# Patient Record
Sex: Female | Born: 1955
Health system: Southern US, Community
[De-identification: ages and names within clinical notes are randomized; demographics above are authoritative.]

## PROBLEM LIST (undated history)

## (undated) DIAGNOSIS — R011 Cardiac murmur, unspecified: Secondary | ICD-10-CM

## (undated) DIAGNOSIS — E785 Hyperlipidemia, unspecified: Secondary | ICD-10-CM

## (undated) DIAGNOSIS — N189 Chronic kidney disease, unspecified: Secondary | ICD-10-CM

## (undated) DIAGNOSIS — K219 Gastro-esophageal reflux disease without esophagitis: Secondary | ICD-10-CM

## (undated) DIAGNOSIS — I10 Essential (primary) hypertension: Secondary | ICD-10-CM

## (undated) DIAGNOSIS — E119 Type 2 diabetes mellitus without complications: Secondary | ICD-10-CM

## (undated) HISTORY — DX: Gastro-esophageal reflux disease without esophagitis: K21.9

## (undated) HISTORY — DX: Cardiac murmur, unspecified: R01.1

## (undated) HISTORY — DX: Type 2 diabetes mellitus without complications: E11.9

## (undated) HISTORY — PX: WISDOM TOOTH EXTRACTION: SHX21

## (undated) HISTORY — DX: Hyperlipidemia, unspecified: E78.5

## (undated) HISTORY — PX: CHOLECYSTECTOMY: SHX55

## (undated) HISTORY — PX: ADRENALECTOMY: SHX876

## (undated) HISTORY — PX: NEPHRECTOMY: SHX65

## (undated) HISTORY — PX: EYE SURGERY: SHX253

## (undated) HISTORY — PX: ABDOMINAL HYSTERECTOMY: SHX81

## (undated) HISTORY — DX: Chronic kidney disease, unspecified: N18.9

---

## 1998-10-09 HISTORY — PX: CHOLECYSTECTOMY: SHX55

## 2011-01-03 ENCOUNTER — Ambulatory Visit (INDEPENDENT_AMBULATORY_CARE_PROVIDER_SITE_OTHER): Payer: 59 | Admitting: Internal Medicine

## 2011-01-03 DIAGNOSIS — K219 Gastro-esophageal reflux disease without esophagitis: Secondary | ICD-10-CM

## 2011-01-03 DIAGNOSIS — R05 Cough: Secondary | ICD-10-CM

## 2011-01-15 NOTE — Consult Note (Signed)
NAME:  Samantha Camacho, Samantha Camacho             ACCOUNT NO.:  [id number]  MEDICAL RECORD NO.:  [id number]          PATIENT TYPE:  LOCATION:                                 FACILITY:  PHYSICIAN:  Lionel December, M.D.         DATE OF BIRTH:  DATE OF CONSULTATION:  01/03/2011 DATE OF DISCHARGE:                                CONSULTATION   REASON FOR CONSULT:  Chronic cough.  HISTORY OF PRESENT ILLNESS:  Samantha Camacho is a 55 year old female referred to our office by Dr. Ernestine Conrad for a chronic cough, states she can feel the mucus in her throat.  She denies any acid reflux.  She says she has had these symptoms for 2-3 years.  She does say her cough is worse after she eats a meal or drinks.  She denies a prior history of allergies. She states she was seen by an ENT for her cough about a year ago and her exam was normal.  She was taken off ACE inhibitor and placed on Norvasc, but there was no change in her cough.  She says her symptoms are not worse at night.  She does, however, cough when she goes to bed.  She has been taking her omeprazole right before she goes to bed.  She says her appetite is good.  There has been no weight loss.  She has a normal bowel movement.  She denies any melena or rectal bleeding.  There has been no dysphagia.  No weight loss.  There are no known allergies.  Home medications include: 1. Norvasc 10 mg a day. 2. Premarin 0.625 a day. 3. Omeprazole 40 mg a day.  SURGERY:  She has had a complete hysterectomy, cholecystectomy.  She has had 2 C-sections.  She has hypertension.  Her mother is deceased from an MI.  Her father was deceased.  He was an alcoholic.  Three sisters and they are all diabetics, one brother that is a diabetic.  She is married. She is unemployed.  She does not smoke, drink or do drugs and she has 2 children in good health.  OBJECTIVE:  VITAL SIGNS:  Her weight is 223.5, her height is 5 feet 1 inches, her temp is 97.7, her blood pressure is 144/84 and  her pulse is 72.  MOUTH:  She has natural teeth.  Her oral mucosa is moist.  There are no lesions. EYES:  Her conjunctivae are pink.  Her sclerae are anicteric. NECK:  Her thyroid is normal.  There is no cervical lymphadenopathy. LUNGS:  Clear.  Chest x-ray December 13, 2010, revealed no active cardiopulmonary abnormalities. HEART:  Regular rate and rhythm. ABDOMEN:  Obese, soft.  Bowel sounds are positive.  No masses.  No tenderness. EXTREMITIES:  There is no edema to her lower extremities.  ASSESSMENT:  Samantha Camacho is a 55 year old female presenting today with a chronic cough.  This very well could be symptoms of acid reflux.  RECOMMENDATIONS:  She will elevate the head of her bed.  She will take her omeprazole new prescription 20 mg will be ordered.  She will take b.i.d. 30 minutes before breakfast and  30 minutes before supper.  I would also like to get a pH study on her and further recommendations once we have the pH study back.  She should call with a progress report in 2 weeks.    ______________________________ Dorene Ar, NP   ______________________________ Lionel December, M.D.    TS/MEDQ  D:  01/03/2011  T:  01/04/2011  Job:  [id number]  Electronically Signed by Dorene Ar PA on 01/10/2011 08:34:42 AM Electronically Signed by Lionel December M.D. on 01/15/2011 01:27:56 PM

## 2011-04-13 ENCOUNTER — Ambulatory Visit (INDEPENDENT_AMBULATORY_CARE_PROVIDER_SITE_OTHER): Payer: 59 | Admitting: Internal Medicine

## 2015-07-24 LAB — HM DIABETES EYE EXAM

## 2016-10-12 ENCOUNTER — Inpatient Hospital Stay (HOSPITAL_COMMUNITY)
Admission: EM | Admit: 2016-10-12 | Discharge: 2016-10-14 | DRG: 639 | Disposition: A | Discharge status: Home / Self Care | Payer: 59 | Attending: Nephrology | Admitting: Nephrology

## 2016-10-12 ENCOUNTER — Encounter (HOSPITAL_COMMUNITY): Payer: Self-pay | Admitting: Emergency Medicine

## 2016-10-12 DIAGNOSIS — Z79899 Other long term (current) drug therapy: Secondary | ICD-10-CM | POA: Diagnosis not present

## 2016-10-12 DIAGNOSIS — E1110 Type 2 diabetes mellitus with ketoacidosis without coma: Secondary | ICD-10-CM | POA: Diagnosis not present

## 2016-10-12 DIAGNOSIS — E1159 Type 2 diabetes mellitus with other circulatory complications: Secondary | ICD-10-CM | POA: Insufficient documentation

## 2016-10-12 DIAGNOSIS — I10 Essential (primary) hypertension: Secondary | ICD-10-CM | POA: Diagnosis not present

## 2016-10-12 DIAGNOSIS — E876 Hypokalemia: Secondary | ICD-10-CM | POA: Diagnosis not present

## 2016-10-12 DIAGNOSIS — Z833 Family history of diabetes mellitus: Secondary | ICD-10-CM | POA: Diagnosis not present

## 2016-10-12 DIAGNOSIS — E1310 Other specified diabetes mellitus with ketoacidosis without coma: Secondary | ICD-10-CM | POA: Diagnosis not present

## 2016-10-12 DIAGNOSIS — Z23 Encounter for immunization: Secondary | ICD-10-CM | POA: Diagnosis not present

## 2016-10-12 HISTORY — DX: Essential (primary) hypertension: I10

## 2016-10-12 LAB — BASIC METABOLIC PANEL
Anion gap: 19 — ABNORMAL HIGH (ref 5–15)
Anion gap: 12 (ref 5–15)
Anion gap: 5 (ref 5–15)
Anion gap: 5 (ref 5–15)
BUN: 22 mg/dL — AB (ref 6–20)
BUN: 18 mg/dL (ref 6–20)
BUN: 14 mg/dL (ref 6–20)
BUN: 12 mg/dL (ref 6–20)
CALCIUM: 8.5 mg/dL — AB (ref 8.9–10.3)
CALCIUM: 8.3 mg/dL — AB (ref 8.9–10.3)
CO2: 22 mmol/L (ref 22–32)
CO2: 21 mmol/L — ABNORMAL LOW (ref 22–32)
CO2: 26 mmol/L (ref 22–32)
CO2: 24 mmol/L (ref 22–32)
CREATININE: 1.14 mg/dL — AB (ref 0.44–1.00)
CREATININE: 0.72 mg/dL (ref 0.44–1.00)
CREATININE: 0.62 mg/dL (ref 0.44–1.00)
Calcium: 9.7 mg/dL (ref 8.9–10.3)
Calcium: 8.6 mg/dL — ABNORMAL LOW (ref 8.9–10.3)
Chloride: 101 mmol/L (ref 101–111)
Chloride: 114 mmol/L — ABNORMAL HIGH (ref 101–111)
Chloride: 117 mmol/L — ABNORMAL HIGH (ref 101–111)
Chloride: 119 mmol/L — ABNORMAL HIGH (ref 101–111)
Creatinine, Ser: 0.92 mg/dL (ref 0.44–1.00)
GFR calc Af Amer: >60 mL/min (ref >60)
GFR calc Af Amer: >60 mL/min (ref >60)
GFR calc Af Amer: >60 mL/min (ref >60)
GFR, EST AFRICAN AMERICAN: 59 mL/min — AB (ref >60)
GFR, EST NON AFRICAN AMERICAN: 51 mL/min — AB (ref >60)
GLUCOSE: 433 mg/dL — AB (ref 65–99)
GLUCOSE: 291 mg/dL — AB (ref 65–99)
GLUCOSE: 172 mg/dL — AB (ref 65–99)
Glucose, Bld: 667 mg/dL (ref 65–99)
Potassium: 4 mmol/L (ref 3.5–5.1)
Potassium: 3.8 mmol/L (ref 3.5–5.1)
Potassium: 4.2 mmol/L (ref 3.5–5.1)
Potassium: 3.4 mmol/L — ABNORMAL LOW (ref 3.5–5.1)
SODIUM: 142 mmol/L (ref 135–145)
SODIUM: 147 mmol/L — AB (ref 135–145)
Sodium: 148 mmol/L — ABNORMAL HIGH (ref 135–145)
Sodium: 148 mmol/L — ABNORMAL HIGH (ref 135–145)

## 2016-10-12 LAB — URINALYSIS, ROUTINE W REFLEX MICROSCOPIC
Bacteria, UA: NONE SEEN
Bilirubin Urine: NEGATIVE
Hgb urine dipstick: NEGATIVE
Ketones, ur: 80 mg/dL — AB
Leukocytes, UA: NEGATIVE
Nitrite: NEGATIVE
PH: 5 (ref 5.0–8.0)
Protein, ur: NEGATIVE mg/dL
Specific Gravity, Urine: 1.02 (ref 1.005–1.030)

## 2016-10-12 LAB — BLOOD GAS, VENOUS
Acid-base deficit: 3.4 mmol/L — ABNORMAL HIGH (ref 0.0–2.0)
Bicarbonate: 20.7 mmol/L (ref 20.0–28.0)
FIO2: 21
O2 Saturation: 74.3 %
PCO2 VEN: 43.1 mmHg — AB (ref 44.0–60.0)
PH VEN: 7.322 (ref 7.250–7.430)
PO2 VEN: 44.8 mmHg (ref 32.0–45.0)

## 2016-10-12 LAB — GLUCOSE, CAPILLARY
GLUCOSE-CAPILLARY: 337 mg/dL — AB (ref 65–99)
GLUCOSE-CAPILLARY: 188 mg/dL — AB (ref 65–99)
GLUCOSE-CAPILLARY: 139 mg/dL — AB (ref 65–99)
Glucose-Capillary: 360 mg/dL — ABNORMAL HIGH (ref 65–99)
Glucose-Capillary: 283 mg/dL — ABNORMAL HIGH (ref 65–99)
Glucose-Capillary: 255 mg/dL — ABNORMAL HIGH (ref 65–99)
Glucose-Capillary: 231 mg/dL — ABNORMAL HIGH (ref 65–99)
Glucose-Capillary: 224 mg/dL — ABNORMAL HIGH (ref 65–99)
Glucose-Capillary: 197 mg/dL — ABNORMAL HIGH (ref 65–99)

## 2016-10-12 LAB — CBC WITH DIFFERENTIAL/PLATELET
BASOS PCT: 0 %
Basophils Absolute: 0.1 10*3/uL (ref 0.0–0.1)
EOS ABS: 0 10*3/uL (ref 0.0–0.7)
EOS PCT: 0 %
HCT: 46.5 % — ABNORMAL HIGH (ref 36.0–46.0)
Hemoglobin: 15.2 g/dL — ABNORMAL HIGH (ref 12.0–15.0)
LYMPHS ABS: 1.4 10*3/uL (ref 0.7–4.0)
Lymphocytes Relative: 12 %
MCH: 30.8 pg (ref 26.0–34.0)
MCHC: 32.7 g/dL (ref 30.0–36.0)
MCV: 94.1 fL (ref 78.0–100.0)
Monocytes Absolute: 0.4 10*3/uL (ref 0.1–1.0)
Monocytes Relative: 3 %
Neutro Abs: 9.6 10*3/uL — ABNORMAL HIGH (ref 1.7–7.7)
Neutrophils Relative %: 84 %
PLATELETS: 177 10*3/uL (ref 150–400)
RBC: 4.94 MIL/uL (ref 3.87–5.11)
RDW: 14.2 % (ref 11.5–15.5)
WBC: 11.4 10*3/uL — AB (ref 4.0–10.5)

## 2016-10-12 LAB — CBG MONITORING, ED
GLUCOSE-CAPILLARY: 417 mg/dL — AB (ref 65–99)
Glucose-Capillary: 482 mg/dL — ABNORMAL HIGH (ref 65–99)

## 2016-10-12 LAB — MAGNESIUM: Magnesium: 1.8 mg/dL (ref 1.7–2.4)

## 2016-10-12 LAB — PHOSPHORUS: Phosphorus: 2.9 mg/dL (ref 2.5–4.6)

## 2016-10-12 LAB — MRSA PCR SCREENING: MRSA by PCR: NEGATIVE

## 2016-10-12 MED ORDER — POTASSIUM CHLORIDE CRYS ER 20 MEQ PO TBCR
40.0000 meq | EXTENDED_RELEASE_TABLET | Freq: Once | ORAL | Status: AC
  Administered 2016-10-13: 40 meq via ORAL
  Filled 2016-10-12: qty 2

## 2016-10-12 MED ORDER — ACETAMINOPHEN 325 MG PO TABS: 650.0000 mg | ORAL_TABLET | Freq: Four times a day (QID) | ORAL | Status: DC | PRN

## 2016-10-12 MED ORDER — FAMOTIDINE IN NACL 20-0.9 MG/50ML-% IV SOLN
20.0000 mg | Freq: Two times a day (BID) | INTRAVENOUS | Status: DC
  Administered 2016-10-12 – 2016-10-13 (×2): 20 mg via INTRAVENOUS
  Filled 2016-10-12 (×2): qty 50

## 2016-10-12 MED ORDER — INSULIN ASPART 100 UNIT/ML ~~LOC~~ SOLN
0.0000 [IU] | Freq: Every day | SUBCUTANEOUS | Status: DC
  Administered 2016-10-13: 3 [IU] via SUBCUTANEOUS

## 2016-10-12 MED ORDER — SODIUM CHLORIDE 0.9 % IV BOLUS (SEPSIS)
1000.0000 mL | Freq: Once | INTRAVENOUS | Status: AC
  Administered 2016-10-12: 1000 mL via INTRAVENOUS

## 2016-10-12 MED ORDER — ONDANSETRON HCL 4 MG/2ML IJ SOLN: 4.0000 mg | Freq: Four times a day (QID) | INTRAMUSCULAR | Status: DC | PRN

## 2016-10-12 MED ORDER — ENOXAPARIN SODIUM 40 MG/0.4ML ~~LOC~~ SOLN
40.0000 mg | SUBCUTANEOUS | Status: DC
  Administered 2016-10-12 – 2016-10-13 (×2): 40 mg via SUBCUTANEOUS
  Filled 2016-10-12 (×2): qty 0.4

## 2016-10-12 MED ORDER — ONDANSETRON HCL 4 MG PO TABS
4.0000 mg | ORAL_TABLET | Freq: Four times a day (QID) | ORAL | Status: DC | PRN
Start: 2016-10-12 — End: 2016-10-14

## 2016-10-12 MED ORDER — DEXTROSE-NACL 5-0.45 % IV SOLN: INTRAVENOUS | Status: DC

## 2016-10-12 MED ORDER — INSULIN DETEMIR 100 UNIT/ML ~~LOC~~ SOLN
15.0000 [IU] | Freq: Every day | SUBCUTANEOUS | Status: DC
  Administered 2016-10-12: 15 [IU] via SUBCUTANEOUS
  Filled 2016-10-12: qty 0.15

## 2016-10-12 MED ORDER — INSULIN ASPART 100 UNIT/ML ~~LOC~~ SOLN
0.0000 [IU] | Freq: Three times a day (TID) | SUBCUTANEOUS | Status: DC
  Administered 2016-10-13 (×3): 11 [IU] via SUBCUTANEOUS
  Administered 2016-10-14: 15 [IU] via SUBCUTANEOUS
  Administered 2016-10-14: 8 [IU] via SUBCUTANEOUS

## 2016-10-12 MED ORDER — ACETAMINOPHEN 650 MG RE SUPP: 650.0000 mg | Freq: Four times a day (QID) | RECTAL | Status: DC | PRN

## 2016-10-12 MED ORDER — SODIUM CHLORIDE 0.9 % IV SOLN: INTRAVENOUS | Status: DC

## 2016-10-12 MED ORDER — VITAMIN D 1000 UNITS PO TABS
1000.0000 [IU] | ORAL_TABLET | Freq: Every day | ORAL | Status: DC
  Administered 2016-10-12 – 2016-10-14 (×3): 1000 [IU] via ORAL
  Filled 2016-10-12 (×3): qty 1

## 2016-10-12 MED ORDER — SODIUM CHLORIDE 0.9 % IV SOLN
INTRAVENOUS | Status: DC
  Administered 2016-10-12: 4.2 [IU]/h via INTRAVENOUS
  Administered 2016-10-12: 4.5 [IU]/h via INTRAVENOUS
  Filled 2016-10-12: qty 2.5

## 2016-10-12 MED ORDER — SODIUM CHLORIDE 0.9 % IV SOLN
INTRAVENOUS | Status: DC
  Administered 2016-10-12: 1000 mL via INTRAVENOUS

## 2016-10-12 MED ORDER — DEXTROSE-NACL 5-0.45 % IV SOLN
INTRAVENOUS | Status: DC
  Administered 2016-10-12: 20:00:00 via INTRAVENOUS

## 2016-10-12 MED ORDER — AMLODIPINE BESYLATE 5 MG PO TABS
10.0000 mg | ORAL_TABLET | Freq: Every day | ORAL | Status: DC
  Administered 2016-10-12 – 2016-10-14 (×3): 10 mg via ORAL
  Filled 2016-10-12 (×3): qty 2

## 2016-10-12 MED ORDER — POTASSIUM CHLORIDE 10 MEQ/100ML IV SOLN
10.0000 meq | INTRAVENOUS | Status: AC
  Administered 2016-10-12 (×4): 10 meq via INTRAVENOUS
  Filled 2016-10-12: qty 100

## 2016-10-12 MED ORDER — PNEUMOCOCCAL VAC POLYVALENT 25 MCG/0.5ML IJ INJ
0.5000 mL | INJECTION | INTRAMUSCULAR | Status: AC
  Administered 2016-10-13: 0.5 mL via INTRAMUSCULAR
  Filled 2016-10-12: qty 0.5

## 2016-10-12 NOTE — ED Provider Notes (Signed)
[facility] DEPT Provider Note   CSN: [id number] Arrival date & time: 10/12/16  1025     History   Chief Complaint Chief Complaint  Patient presents with  . Hyperglycemia    HPI Samantha Camacho is a 61 y.o. female.  Patient with past medical history of hypertension presents to the emergency department with chief complaint of hyperglycemia. She states that over the past 2 weeks, she has had rapid weight loss, has had polydipsia, and polyuria. Last night she used her husband's glucometer to check her blood sugar. The reading was over 600. This morning, the reading was the same. They called their nurse hotline on the insurance card, and were instructed to come to the emergency department for evaluation. Patient reports that in addition to the symptoms she has felt "fuzzy headed" at times. She attributed her polyuria to having drunk a lot of water. She denies any pain. Denies any recent illnesses. There are no other associated symptoms.   The history is provided by the patient. No language interpreter was used.    Past Medical History:  Diagnosis Date  . Hypertension     There are no active problems to display for this patient.   Past Surgical History:  Procedure Laterality Date  . ABDOMINAL HYSTERECTOMY      OB History    No data available       Home Medications    Prior to Admission medications   Not on File    Family History No family history on file.  Social History Social History  Substance Use Topics  . Smoking status: Never Smoker  . Smokeless tobacco: Never Used  . Alcohol use Yes     Comment: rarely     Allergies   Patient has no allergy information on record.   Review of Systems Review of Systems  All other systems reviewed and are negative.    Physical Exam Updated Vital Signs BP 126/75 (BP Location: Left Arm)   Pulse 80   Temp 97.7 F (36.5 C) (Oral)   Resp 20   Ht 5\' 1"  (1.549 m)   Wt 92.5 kg   SpO2 99%   BMI 38.55 kg/m    Physical Exam  Constitutional: She is oriented to person, place, and time. She appears well-developed and well-nourished.  HENT:  Head: Normocephalic and atraumatic.  Eyes: Conjunctivae and EOM are normal. Pupils are equal, round, and reactive to light.  Neck: Normal range of motion. Neck supple.  Cardiovascular: Normal rate and regular rhythm.  Exam reveals no gallop and no friction rub.   No murmur heard. Pulmonary/Chest: Effort normal and breath sounds normal. No respiratory distress. She has no wheezes. She has no rales. She exhibits no tenderness.  Abdominal: Soft. Bowel sounds are normal. She exhibits no distension and no mass. There is no tenderness. There is no rebound and no guarding.  Musculoskeletal: Normal range of motion. She exhibits no edema or tenderness.  Neurological: She is alert and oriented to person, place, and time.  Skin: Skin is warm and dry.  Psychiatric: She has a normal mood and affect. Her behavior is normal. Judgment and thought content normal.  Nursing note and vitals reviewed.    ED Treatments / Results  Labs (all labs ordered are listed, but only abnormal results are displayed) Labs Reviewed  CBC WITH DIFFERENTIAL/PLATELET - Abnormal; Notable for the following:       Result Value   WBC 11.4 (*)    Hemoglobin 15.2 (*)  HCT 46.5 (*)    Neutro Abs 9.6 (*)    All other components within normal limits  BASIC METABOLIC PANEL - Abnormal; Notable for the following:    Glucose, Bld 667 (*)    BUN 22 (*)    Creatinine, Ser 1.14 (*)    GFR calc non Af Amer 51 (*)    GFR calc Af Amer 59 (*)    Anion gap 19 (*)    All other components within normal limits  BLOOD GAS, VENOUS - Abnormal; Notable for the following:    pCO2, Ven 43.1 (*)    Acid-base deficit 3.4 (*)    All other components within normal limits  CBG MONITORING, ED - Abnormal; Notable for the following:    Glucose-Capillary >600 (*)    All other components within normal limits   URINALYSIS, ROUTINE W REFLEX MICROSCOPIC    EKG  EKG Interpretation None       Radiology No results found.  Procedures Procedures (including critical care time)  Medications Ordered in ED Medications  insulin regular (NOVOLIN R,HUMULIN R) 250 Units in sodium chloride 0.9 % 250 mL (1 Units/mL) infusion (not administered)  sodium chloride 0.9 % bolus 1,000 mL (1,000 mLs Intravenous New Bag/Given 10/12/16 1211)    And  sodium chloride 0.9 % bolus 1,000 mL (not administered)    And  0.9 %  sodium chloride infusion (not administered)  dextrose 5 %-0.45 % sodium chloride infusion (not administered)  sodium chloride 0.9 % bolus 1,000 mL (1,000 mLs Intravenous New Bag/Given 10/12/16 1107)     Initial Impression / Assessment and Plan / ED Course  I have reviewed the triage vital signs and the nursing notes.  Pertinent labs & imaging results that were available during my care of the patient were reviewed by me and considered in my medical decision making (see chart for details).  Clinical Course     Patient with hyperglycemia. Symptoms are consistent with new onset diabetes. Will check labs, give fluids, and plan for glucose correction.  CBG is 667. Bicarbonate is normal. VBG is 7.32. Anion gap is 19. Patient discussed with Dr. Gilford Raid, who agrees with plan for treatment with glucose stabilizer and admission to the hospital. Urinalysis pending.  12:33 PM Appreciate Dr. Carolin Sicks for admitting the patient.    CRITICAL CARE Performed by: Montine Circle   Total critical care time: 35 minutes  Critical care time was exclusive of separately billable procedures and treating other patients.  Critical care was necessary to treat or prevent imminent or life-threatening deterioration.  Critical care was time spent personally by me on the following activities: development of treatment plan with patient and/or surrogate as well as nursing, discussions with consultants, evaluation of  patient's response to treatment, examination of patient, obtaining history from patient or surrogate, ordering and performing treatments and interventions, ordering and review of laboratory studies, ordering and review of radiographic studies, pulse oximetry and re-evaluation of patient's condition.   Final Clinical Impressions(s) / ED Diagnoses   Final diagnoses:  Diabetic ketoacidosis without coma associated with other specified diabetes mellitus [facility])    New Prescriptions New Prescriptions   No medications on file     Montine Circle, PA-C 10/12/16 1234    Isla Pence, MD 10/12/16 [phone]

## 2016-10-12 NOTE — H&P (Signed)
History and Physical    Samantha Camacho [id number] DOB: Jul 19, 1956 DOA: 10/12/2016  PCP: Celedonio Savage, MD  Patient coming from: Home  Chief Complaint: Hyperglycemia with blood sugar level more than 600  HPI: Samantha Camacho is a 61 y.o. female with medical history significant of hypertension presented with generalized weakness, polydipsia, polyuria, weight loss for about 3-4 weeks. Patient reported that her husband has diabetes and checked her blood sugar level today morning which was consistent with more than 600. They called the insurance company are in and spoke with them. They direct. Patient to come to hospital for further evaluation. Patient reported that she has generalized weakness and unable to concentrate, fatigue, poor oral intake, polyuria, polydipsia, weight loss of about 20 pounds in 6 weeks. The overall symptoms worsening gradually. Denied fever, chills, headache, dizziness, chest pain, shortness of breath, cough, runny nose, diarrhea, constipation, abdominal pain, dysuria, urgency or frequency. No sick contact or recent travel. Patient reported that her father, mother, brothers and sister all of them have diabetes. She does not work.  ED Course: In the ER patient was found to have elevated anion gap consistent with diabetic ketoacidosis. Treated with IV fluid and insulin drip and admitted for further evaluation.  Review of Systems: As per HPI otherwise 10 point review of systems negative.    Past Medical History:  Diagnosis Date  . Hypertension     Past Surgical History:  Procedure Laterality Date  . ABDOMINAL HYSTERECTOMY       Social history: reports that she has never smoked. She has never used smokeless tobacco. She reports that she drinks alcohol. She reports that she does not use drugs.  Allergies: Reviewed. No known drug allergy.  Not on File  Family history reviewed with the patient. Her both parents and sibling have diabetes.   Prior to Admission  medications   Medication Sig Start Date End Date Taking? Authorizing Provider  amLODipine (NORVASC) 10 MG tablet Take 1 tablet by mouth daily. 09/13/16  Yes Historical Provider, MD  cholecalciferol (VITAMIN D) 1000 units tablet Take 1,000 Units by mouth daily.   Yes Historical Provider, MD  ranitidine (ZANTAC) 150 MG tablet Take 1 tablet by mouth 2 (two) times daily. 09/13/16  Yes Historical Provider, MD    Physical Exam: Vitals:   10/12/16 1330 10/12/16 1343 10/12/16 1345 10/12/16 1400  BP: 133/76   141/76  Pulse: 79 83 69 65  Resp:  17 23 22   Temp:      TempSrc:      SpO2: 100% 100% 100% 96%  Weight:      Height:          Constitutional: NAD, calm, comfortable Vitals:   10/12/16 1330 10/12/16 1343 10/12/16 1345 10/12/16 1400  BP: 133/76   141/76  Pulse: 79 83 69 65  Resp:  17 23 22   Temp:      TempSrc:      SpO2: 100% 100% 100% 96%  Weight:      Height:       Eyes: PERRL, lids and conjunctivae normal ENMT: Mucous membranes are Dry. Normal dentition.  Neck: normal, supple, no masses, no thyromegaly Respiratory: clear to auscultation bilaterally, no wheezing, no crackles. Normal respiratory effort. No accessory muscle use.  Cardiovascular: Regular rate and rhythm, no murmurs / rubs / gallops. No extremity edema. 2+ pedal pulses. No carotid bruits.  Abdomen: no tenderness, no masses palpated. No hepatosplenomegaly. Bowel sounds positive.  Musculoskeletal: no clubbing / cyanosis. No joint deformity upper  and lower extremities.  Normal muscle tone.  Skin: no rashes, lesions, ulcers. No induration Neurologic: CN 2-12 grossly intact.  Strength 5/5 in all 4.  Psychiatric: Normal judgment and insight. Alert and oriented x 3. Normal mood.    Labs on Admission: I have personally reviewed following labs and imaging studies  CBC:  Recent Labs Lab 10/12/16 1105  WBC 11.4*  NEUTROABS 9.6*  HGB 15.2*  HCT 46.5*  MCV 94.1  PLT 123XX123   Basic Metabolic Panel:  Recent  Labs Lab 10/12/16 1105  NA 142  K 4.0  CL 101  CO2 22  GLUCOSE 667*  BUN 22*  CREATININE 1.14*  CALCIUM 9.7   GFR: Estimated Creatinine Clearance: 53.7 mL/min (by C-G formula based on SCr of 1.14 mg/dL (H)). Liver Function Tests: No results for input(s): AST, ALT, ALKPHOS, BILITOT, PROT, ALBUMIN in the last 168 hours. No results for input(s): LIPASE, AMYLASE in the last 168 hours. No results for input(s): AMMONIA in the last 168 hours. Coagulation Profile: No results for input(s): INR, PROTIME in the last 168 hours. Cardiac Enzymes: No results for input(s): CKTOTAL, CKMB, CKMBINDEX, TROPONINI in the last 168 hours. BNP (last 3 results) No results for input(s): PROBNP in the last 8760 hours. HbA1C: No results for input(s): HGBA1C in the last 72 hours. CBG:  Recent Labs Lab 10/12/16 1115 10/12/16 1329  GLUCAP >600* 482*   Lipid Profile: No results for input(s): CHOL, HDL, LDLCALC, TRIG, CHOLHDL, LDLDIRECT in the last 72 hours. Thyroid Function Tests: No results for input(s): TSH, T4TOTAL, FREET4, T3FREE, THYROIDAB in the last 72 hours. Anemia Panel: No results for input(s): VITAMINB12, FOLATE, FERRITIN, TIBC, IRON, RETICCTPCT in the last 72 hours. Urine analysis:    Component Value Date/Time   COLORURINE STRAW (A) 10/12/2016 1105   APPEARANCEUR CLEAR 10/12/2016 1105   LABSPEC 1.020 10/12/2016 1105   PHURINE 5.0 10/12/2016 1105   GLUCOSEU >=500 (A) 10/12/2016 1105   HGBUR NEGATIVE 10/12/2016 1105   BILIRUBINUR NEGATIVE 10/12/2016 1105   KETONESUR 80 (A) 10/12/2016 1105   PROTEINUR NEGATIVE 10/12/2016 1105   NITRITE NEGATIVE 10/12/2016 1105   LEUKOCYTESUR NEGATIVE 10/12/2016 1105   Sepsis Labs: !!!!!!!!!!!!!!!!!!!!!!!!!!!!!!!!!!!!!!!!!!!! @LABRCNTIP (procalcitonin:4,lacticidven:4) )No results found for this or any previous visit (from the past 240 hour(s)).   Radiological Exams on Admission: No results found.  [id number]  Assessment/Plan Active Problems:    DKA (diabetic ketoacidoses) ([place])  #Acute diabetic ketoacidosis without coma: Patient with new onset of diabetes. Admitted with signs and symptoms of hypoglycemia. -Continue IV fluid, insulin drip. -Monitor electrolytes -Diabetic educator referred. Follow-up A1c level. Follow up frequent BMP. Patient likely needs to transition to long-acting insulin depending on A1c level. -Nothing by mouth. -Pepcid IV twice a day. -UA consistent with glucosuria without any protein..  #Essential hypertension: Her pressure acceptable. Continue home medication. Monitor blood pressure.  DVT prophylaxis: Lovenox subcutaneous Code Status: Full code Family Communication: No family present at bedside Disposition Plan: Likely discharge home in 1-2 days Consults called: None Admission status: Inpatient.   Dron Tanna Furry MD [facility] Pager [phone]  If 7PM-7AM, please contact night-coverage [email or web address] Password [email or web address]  10/12/2016, 2:10 PM

## 2016-10-12 NOTE — ED Triage Notes (Signed)
Pt states she has been drinking a lot of water and frequently voiding at night, rapid weight loss in 2 weeks. Husband used his meter to check BS today it was over 600, states she can not concentrate

## 2016-10-12 NOTE — ED Notes (Signed)
Called ICU to give report, nurse unable to take report and asked for nurse to call back when able

## 2016-10-13 LAB — BASIC METABOLIC PANEL
ANION GAP: 6 (ref 5–15)
ANION GAP: 6 (ref 5–15)
BUN: 12 mg/dL (ref 6–20)
BUN: 12 mg/dL (ref 6–20)
CHLORIDE: 117 mmol/L — AB (ref 101–111)
CO2: 22 mmol/L (ref 22–32)
CO2: 24 mmol/L (ref 22–32)
CREATININE: 0.7 mg/dL (ref 0.44–1.00)
Calcium: 8.2 mg/dL — ABNORMAL LOW (ref 8.9–10.3)
Calcium: 8.6 mg/dL — ABNORMAL LOW (ref 8.9–10.3)
Chloride: 117 mmol/L — ABNORMAL HIGH (ref 101–111)
Creatinine, Ser: 0.62 mg/dL (ref 0.44–1.00)
Glucose, Bld: 297 mg/dL — ABNORMAL HIGH (ref 65–99)
Glucose, Bld: 328 mg/dL — ABNORMAL HIGH (ref 65–99)
POTASSIUM: 4.2 mmol/L (ref 3.5–5.1)
Potassium: 4.2 mmol/L (ref 3.5–5.1)
SODIUM: 147 mmol/L — AB (ref 135–145)
Sodium: 145 mmol/L (ref 135–145)

## 2016-10-13 LAB — GLUCOSE, CAPILLARY
GLUCOSE-CAPILLARY: 242 mg/dL — AB (ref 65–99)
GLUCOSE-CAPILLARY: 325 mg/dL — AB (ref 65–99)
Glucose-Capillary: 177 mg/dL — ABNORMAL HIGH (ref 65–99)
Glucose-Capillary: 262 mg/dL — ABNORMAL HIGH (ref 65–99)
Glucose-Capillary: 303 mg/dL — ABNORMAL HIGH (ref 65–99)
Glucose-Capillary: 349 mg/dL — ABNORMAL HIGH (ref 65–99)

## 2016-10-13 LAB — HEMOGLOBIN A1C
HEMOGLOBIN A1C: 12.2 % — AB (ref 4.8–5.6)
MEAN PLASMA GLUCOSE: 303 mg/dL

## 2016-10-13 MED ORDER — LIVING WELL WITH DIABETES BOOK
Freq: Once | Status: AC
  Administered 2016-10-13: 09:00:00
  Filled 2016-10-13: qty 1

## 2016-10-13 MED ORDER — INSULIN DETEMIR 100 UNIT/ML ~~LOC~~ SOLN
25.0000 [IU] | Freq: Every day | SUBCUTANEOUS | Status: DC
  Administered 2016-10-13: 25 [IU] via SUBCUTANEOUS
  Filled 2016-10-13 (×3): qty 0.25

## 2016-10-13 MED ORDER — INSULIN ASPART 100 UNIT/ML ~~LOC~~ SOLN
5.0000 [IU] | Freq: Three times a day (TID) | SUBCUTANEOUS | Status: DC
  Administered 2016-10-13 – 2016-10-14 (×4): 5 [IU] via SUBCUTANEOUS

## 2016-10-13 MED ORDER — INSULIN STARTER KIT- PEN NEEDLES (ENGLISH)
1.0000 | Freq: Once | Status: AC
  Administered 2016-10-13: 1
  Filled 2016-10-13: qty 1

## 2016-10-13 NOTE — Progress Notes (Signed)
PROGRESS NOTE    Samantha Camacho  [id number] DOB: 06-Jan-1956 DOA: 10/12/2016 PCP: Celedonio Savage, MD   Brief Narrative: 61 y.o. female with medical history significant of hypertension presented with generalized weakness, polydipsia, polyuria, weight loss for about 3-4 weeks. Patient reported that her husband has diabetes and checked her blood sugar level which was consistent with more than 600. They called the insurance company nurse for diabetic patient to come to Hospital. Patient will with new onset of diabetes and diabetic ketoacidosis.  Assessment & Plan:   Active Problems:   Diabetic acidosis without coma ([place])  #Diabetic ketoacidosis without coma. Patient with new  diagnosis of diabetes. A1c of 12.2. -Off insulin drip with close anion gap. Transitioned to Levemir 25 units, NovoLog 5 units 3 times a day with meals and sliding scale. Patient is still with elevated blood sugar level. Continue to monitor blood sugar level with new regimen. Patient needs to go home with new prescriptions for insulin. She needs teaching regarding insulin injection and monitoring blood sugar level. I educated patient about diet and diabetes management at bedside with the patient. -Diabetic educator appreciated. -Diabetic diet. -Discontinue Pepcid. -We'll transfer patient out of ICU.  #Hypertension: Monitor blood pressure level. Blood pressure is acceptable. Continue Norvasc.  DVT prophylaxis: Lovenox subcutaneous Code Status: Full code Family Communication: No family present bedside Disposition Plan: Likely discharge home tomorrow.    Consultants:   None  Procedures: None Antimicrobials: None  Subjective: Patient was seen and examined at bedside. Reported weakness but denied fever, chills, nausea, vomiting, chest pain or shortness of breath. She reported feeling much better than when she came in.   Objective: Vitals:   10/13/16 0400 10/13/16 0500 10/13/16 0600 10/13/16 0700  BP: (!) 141/72  126/75 133/75 129/80  Pulse: (!) 58 (!) 54 62 (!) 58  Resp: (!) 22 19 18 15   Temp: 97.9 F (36.6 C)     TempSrc:      SpO2: 93% 91% 91% 94%  Weight:      Height:        Intake/Output Summary (Last 24 hours) at 10/13/16 1037 Last data filed at 10/13/16 0132  Gross per 24 hour  Intake          3268.02 ml  Output             1225 ml  Net          2043.02 ml   Filed Weights   10/12/16 1038 10/12/16 1450 10/12/16 1504  Weight: 92.5 kg (204 lb) 95.6 kg (210 lb 12.2 oz) 95.6 kg (210 lb 12.2 oz)    Examination:  General exam: Appears calm and comfortable  Respiratory system: Clear to auscultation. Respiratory effort normal. No wheezing or crackle Cardiovascular system: S1 & S2 heard, RRR.  No pedal edema. Gastrointestinal system: Abdomen is nondistended, soft and nontender. Normal bowel sounds heard. Central nervous system: Alert and oriented. No focal neurological deficits. Extremities: Symmetric 5 x 5 power. Skin: No rashes, lesions or ulcers Psychiatry: Judgement and insight appear normal. Mood & affect appropriate.     Data Reviewed: I have personally reviewed following labs and imaging studies  CBC:  Recent Labs Lab 10/12/16 1105  WBC 11.4*  NEUTROABS 9.6*  HGB 15.2*  HCT 46.5*  MCV 94.1  PLT 123XX123   Basic Metabolic Panel:  Recent Labs Lab 10/12/16 1522 10/12/16 1843 10/12/16 2254 10/13/16 0300 10/13/16 0656  NA 147* 148* 148* 145 147*  K 3.8 4.2 3.4* 4.2 4.2  CL 114* 117* 119* 117* 117*  CO2 21* 26 24 22 24   GLUCOSE 433* 291* 172* 297* 328*  BUN 18 14 12 12 12   CREATININE 0.92 0.72 0.62 0.62 0.70  CALCIUM 8.6* 8.5* 8.3* 8.2* 8.6*  MG 1.8  --   --   --   --   PHOS 2.9  --   --   --   --    GFR: Estimated Creatinine Clearance: 78 mL/min (by C-G formula based on SCr of 0.7 mg/dL). Liver Function Tests: No results for input(s): AST, ALT, ALKPHOS, BILITOT, PROT, ALBUMIN in the last 168 hours. No results for input(s): LIPASE, AMYLASE in the last 168  hours. No results for input(s): AMMONIA in the last 168 hours. Coagulation Profile: No results for input(s): INR, PROTIME in the last 168 hours. Cardiac Enzymes: No results for input(s): CKTOTAL, CKMB, CKMBINDEX, TROPONINI in the last 168 hours. BNP (last 3 results) No results for input(s): PROBNP in the last 8760 hours. HbA1C:  Recent Labs  10/12/16 1522  HGBA1C 12.2*   CBG:  Recent Labs Lab 10/12/16 2150 10/12/16 2316 10/13/16 0025 10/13/16 0130 10/13/16 0741  GLUCAP 188* 139* 177* 242* 303*   Lipid Profile: No results for input(s): CHOL, HDL, LDLCALC, TRIG, CHOLHDL, LDLDIRECT in the last 72 hours. Thyroid Function Tests: No results for input(s): TSH, T4TOTAL, FREET4, T3FREE, THYROIDAB in the last 72 hours. Anemia Panel: No results for input(s): VITAMINB12, FOLATE, FERRITIN, TIBC, IRON, RETICCTPCT in the last 72 hours. Sepsis Labs: No results for input(s): PROCALCITON, LATICACIDVEN in the last 168 hours.  Recent Results (from the past 240 hour(s))  MRSA PCR Screening     Status: None   Collection Time: 10/12/16  2:50 PM  Result Value Ref Range Status   MRSA by PCR NEGATIVE NEGATIVE Final    Comment:        The GeneXpert MRSA Assay (FDA approved for NASAL specimens only), is one component of a comprehensive MRSA colonization surveillance program. It is not intended to diagnose MRSA infection nor to guide or monitor treatment for MRSA infections.          Radiology Studies: No results found.      Scheduled Meds: . amLODipine  10 mg Oral Daily  . cholecalciferol  1,000 Units Oral Daily  . enoxaparin (LOVENOX) injection  40 mg Subcutaneous Q24H  . famotidine (PEPCID) IV  20 mg Intravenous Q12H  . insulin aspart  0-15 Units Subcutaneous TID WC  . insulin aspart  0-5 Units Subcutaneous QHS  . insulin aspart  5 Units Subcutaneous TID WC  . insulin detemir  25 Units Subcutaneous QHS   Continuous Infusions:   LOS: 1 day    Dannelle Rhymes Tanna Furry,  MD [facility] Pager [phone]  If 7PM-7AM, please contact night-coverage [email or web address] Password [email or web address] 10/13/2016, 10:37 AM

## 2016-10-13 NOTE — Care Management Note (Signed)
Case Management Note  Patient Details  Name: Samantha Camacho MRN: [id number] Date of Birth: 03/01/1956  Subjective/Objective:    Patient is from home, lives with husband. She in ind with ADL's. She has new diagnosis of DM. Education provided by diabetes coordinator. Patient has recently moved to [place] and would like a new PCP.            Action/Plan: Provider list given for new PCP. No other CM needs.   Expected Discharge Date:  10/17/16               Expected Discharge Plan:  Home/Self Care  In-House Referral:  NA  Discharge planning Services  CM Consult  Post Acute Care Choice:    Choice offered to:     DME Arranged:    DME Agency:     HH Arranged:    HH Agency:     Status of Service:  Completed, signed off  If discussed at [employer] of Stay Meetings, dates discussed:    Additional Comments:  Medrith Veillon, Chauncey Reading, RN 10/13/2016, 4:44 PM

## 2016-10-13 NOTE — Progress Notes (Signed)
Initial Nutrition Assessment    INTERVENTION:  Provided DM diet education    NUTRITION DIAGNOSIS:  Unplanned weight loss related to hyperglycemia and poor appetite as evidenced by A1C 12% and pt / family report    GOAL: Achieve improved glycemic control and practice identifying carbohydrate containing foods and a balanced meal while still in the hospital.   MONITOR:  Po intake, labs + (CBG's) and wt trends     REASON FOR ASSESSMENT:  Malnutrition Screen and DM education consult   ASSESSMENT: Samantha Camacho is a 61 y.o. female with medical history significant of hypertension presented with generalized weakness, polydipsia, polyuria, weight loss for about 3-4 weeks. Patient reported that her husband has diabetes and checked her blood sugar level today morning which was consistent with more than 600.  RD talked with patient and family to review following:  "Carbohydrate Counting for People with Diabetes" and  List of Apps that are DM related, "1 month of meals plan" handouts. Discussed different food groups and their effects on blood sugar, emphasizing carbohydrate-containing foods. Provided list of carbohydrates and recommended serving sizes of common foods.  Discussed importance of controlled and consistent carbohydrate intake throughout the day. Provided examples of ways to balance meals/snacks and encouraged intake of high-fiber, whole grain complex carbohydrates. Teach back method used.  Expect good compliance.  Body mass index is 39.82 kg/m. Pt meets criteria for obesity class II based on current BMI.  Current diet order is CHO modifed, no meal intake data available at this time. Labs and medications reviewed. No further nutrition interventions warranted at this time. RD contact information provided. If additional nutrition issues arise, please re-consult RD.   Lab Results  Component Value Date   HGBA1C 12.2 (H) 10/12/2016      Recent Labs Lab 10/12/16 1522   10/12/16 2254 10/13/16 0300 10/13/16 0656  NA 147*  < > 148* 145 147*  K 3.8  < > 3.4* 4.2 4.2  CL 114*  < > 119* 117* 117*  CO2 21*  < > 24 22 24   BUN 18  < > 12 12 12   CREATININE 0.92  < > 0.62 0.62 0.70  CALCIUM 8.6*  < > 8.3* 8.2* 8.6*  MG 1.8  --   --   --   --   PHOS 2.9  --   --   --   --   GLUCOSE 433*  < > 172* 297* 328*  < > = values in this interval not displayed.    Diet Order:  Diet Carb Modified Fluid consistency: Thin; Room service appropriate? Yes  Skin:   intact  Last BM:   prior to admission  Height:   Ht Readings from Last 1 Encounters:  10/12/16 5\' 1"  (1.549 m)    Weight:   Wt Readings from Last 1 Encounters:  10/12/16 210 lb 12.2 oz (95.6 kg)    Ideal Body Weight:   48 kg  BMI:  Body mass index is 39.82 kg/m. Obesity class II  Estimated Nutritional Needs:   Kcal:    [id number]  Protein:   95-99 gr gr  Fluid:   >1500 ml daily  EDUCATION NEEDS: addressed noted above    Colman Cater MS,RD,CSG,LDN Office: [phone] Pager: [phone]

## 2016-10-13 NOTE — Progress Notes (Addendum)
Inpatient Diabetes Program Recommendations  AACE/ADA: New Consensus Statement on Inpatient Glycemic Control (2015)  Target Ranges:  Prepandial:   less than 140 mg/dL      Peak postprandial:   less than 180 mg/dL (1-2 hours)      Critically ill patients:  140 - 180 mg/dL   Results for MARKESHA, MITTLEMAN (MRN [id number]) as of 10/13/2016 08:15  Ref. Range 10/12/2016 18:57 10/12/2016 19:59 10/12/2016 21:08 10/12/2016 21:50 10/12/2016 23:16 10/13/2016 00:25 10/13/2016 01:30 10/13/2016 07:41  Glucose-Capillary Latest Ref Range: 65 - 99 mg/dL 231 (H) 224 (H) 197 (H) 188 (H) 139 (H) 177 (H) 242 (H) 303 (H)  Results for IYAUNA, NEDROW (MRN [id number]) as of 10/13/2016 08:15  Ref. Range 10/12/2016 11:15 10/12/2016 13:29 10/12/2016 14:21 10/12/2016 14:57 10/12/2016 15:45 10/12/2016 16:47 10/12/2016 17:48  Glucose-Capillary Latest Ref Range: 65 - 99 mg/dL >600 (HH) 482 (H) 417 (H) 360 (H) 337 (H) 283 (H) 255 (H)   Review of Glycemic Control  Diabetes history:No Outpatient Diabetes medications: NA Current orders for Inpatient glycemic control: Levemir 15 units QHS, Novolog 0-15 units TID with meals, Novolog 0-5 units QHS  Inpatient Diabetes Program Recommendations:  Insulin - Basal: Patient received Levemir 15 units at 23:18 on 10/12/16 and fasting glucose 303 mg/dl this morning. Please consider increasing Levemir to 25 units QHS. Insulin - Meal Coverage: Please consider ordering Novolog 5 units TID with meals for meal coverage if patient eats at least 50% of meals. HgbA1C: A1C 12.2% on 10/12/16 indicating an average glucose of 303 mg/dl over the past 2-3 months.  NOTE: Patient was admitted with hyperglycemia and was initially placed on IV insulin drip which was transitioned to SQ insulin last night. A1C 12.2% on 10/12/16; therefore, patient will need to be discharged on insulin for DM control. NURSING: Please use each patient interaction to provide diabetes education. Please review Living Well with Diabetes booklet with the patient, have  patient watch patient education videos on diabetes, and instruct on insulin administration. Please allow patient to be actively engaged with diabetes management by allowing patient to check own glucose and self-administer insulin injections. Diabetes Coordinator will follow up with patient and reinforce diabetes education.  Addendum 10/13/16@12 :30-Spoke with patient and her husband about new diabetes diagnosis. Patient reports that her husband has DM but takes oral DM medication and that both her parents and 2 siblings have DM and use insulin. Patient states that she does not know a lot about DM and would like as much education as possible.  Of note, patient states that she had a routine doctor visit in September and she reports that her doctor told her all her blood work was fine at that time. Discussed A1C results (12.2% on 10/12/16) and explained what an A1C is and informed patient that her current A1C indicates an average glucose of 303 mg/dl over the past 2-3 months. Discussed basic pathophysiology of DM Type 2, basic home care, importance of checking CBGs and maintaining good CBG control to prevent long-term and short-term complications. Reviewed glucose and A1C goals and explained that patient will need to continue to  Reviewed signs and symptoms of hyperglycemia and hypoglycemia along with treatment for both. Discussed impact of nutrition, exercise, stress, sickness, and medications on diabetes control. Living Well with diabetes booklet provided and encouraged patient to read through entire book. Discussed Levemir and Novolog insulin as currently ordered and informed patient that discharge instructions would provide exact dosages she should take at home. Patient has insurance so asked that she call  and find out which insulins her insurance covers (Levemir vs Lantus and Novolog vs Humalog).  Asked patient to check her glucose at least 4 times per day (before meals and at bedtime) and to keep a log book of  glucose readings and insulin taken. Explained how the doctor she follows up with can use the log book to continue to make insulin adjustments if needed. Reviewed and demonstrated how to draw up and administer insulin with vial and syringe and an insulin pen. Patient was able to successfully demonstrate how to draw up and administer insulin with vial and syringe as well as administer insulin with insulin pen.  Patient states that she prefers to use insulin pens as an outpatient.  Informed patient that RN will be asking her to self-administer insulin to ensure proper technique and ability to administer self insulin shots.  Patient states that she recently moved from [place] to [place] and is interested in finding a new PCP in [place] or [place]. Case Management consulted for list of providers in the area accepting new patients.  Patient verbalized understanding of information discussed and she states that she has no further questions at this time related to diabetes.  RNs to provide ongoing basic DM education at bedside with this patient and engage patient to actively check blood glucose and administer insulin injections.   At time of discharge, please provide Rx for glucometer, lancets, test strips, insulin pen(s), and insulin pen needles. Please note, patient is checking with insurance to see which insulins are covered.  Thanks, Barnie Alderman, RN, MSN, CDE Diabetes Coordinator Inpatient Diabetes Program [phone] (Team Pager from 8am to 5pm)

## 2016-10-14 DIAGNOSIS — E876 Hypokalemia: Secondary | ICD-10-CM

## 2016-10-14 LAB — GLUCOSE, CAPILLARY
GLUCOSE-CAPILLARY: 354 mg/dL — AB (ref 65–99)
Glucose-Capillary: 274 mg/dL — ABNORMAL HIGH (ref 65–99)

## 2016-10-14 LAB — BASIC METABOLIC PANEL
Anion gap: 6 (ref 5–15)
BUN: 12 mg/dL (ref 6–20)
CHLORIDE: 114 mmol/L — AB (ref 101–111)
CO2: 25 mmol/L (ref 22–32)
CREATININE: 0.68 mg/dL (ref 0.44–1.00)
Calcium: 8.5 mg/dL — ABNORMAL LOW (ref 8.9–10.3)
Glucose, Bld: 274 mg/dL — ABNORMAL HIGH (ref 65–99)
Potassium: 3.2 mmol/L — ABNORMAL LOW (ref 3.5–5.1)
SODIUM: 145 mmol/L (ref 135–145)

## 2016-10-14 MED ORDER — INSULIN ASPART 100 UNIT/ML FLEXPEN: 7.0000 [IU] | PEN_INJECTOR | Freq: Three times a day (TID) | SUBCUTANEOUS | 1 refills | Status: DC

## 2016-10-14 MED ORDER — POTASSIUM CHLORIDE CRYS ER 20 MEQ PO TBCR
40.0000 meq | EXTENDED_RELEASE_TABLET | Freq: Two times a day (BID) | ORAL | Status: DC
  Administered 2016-10-14: 40 meq via ORAL
  Filled 2016-10-14: qty 2

## 2016-10-14 MED ORDER — POTASSIUM CHLORIDE CRYS ER 20 MEQ PO TBCR: 20.0000 meq | EXTENDED_RELEASE_TABLET | Freq: Every day | ORAL | 0 refills | Status: DC

## 2016-10-14 MED ORDER — INSULIN DETEMIR 100 UNIT/ML FLEXPEN: 35.0000 [IU] | PEN_INJECTOR | Freq: Every day | SUBCUTANEOUS | 1 refills | Status: DC

## 2016-10-14 MED ORDER — "INSULIN SYRINGE 28G X 1/2"" 0.5 ML MISC": 1.0000 [IU] | Freq: Three times a day (TID) | 0 refills | Status: DC

## 2016-10-14 NOTE — Discharge Summary (Signed)
Physician Discharge Summary  Samantha Camacho [id number] DOB: 05/23/1956 DOA: 10/12/2016  PCP: Celedonio Savage, MD  Admit date: 10/12/2016 Discharge date: 10/14/2016  Admitted From:Home Disposition: Home    Recommendations for Outpatient Follow-up:  1. Follow up with PCP in 1-2 weeks 2. Please obtain BMP/CBC in one week   Home Health: No Equipment/Devices: No Discharge Condition: Stable CODE STATUS: Full code Diet recommendation: Carb modified heart healthy diet  Brief/Interim Summary:61 y.o.femalewith medical history significant of hypertension presented with generalized weakness, polydipsia, polyuria, weight loss for about 3-4 weeks. Patient reported that her husband has diabetes and checked her blood sugar level which was consistent with more than 600. They called the insurance company nurse for diabetic patient to come to Hospital. Patient with new onset of diabetes and diabetic ketoacidosis.  On admission patient was found to have diabetic ketoacidosis without,. Patient is a new diagnosis of diabetes. A1c of 12.2. Initially patient was treated with insulin drip, IV fluid until the anion gap is closed. The anion gap closed and patient was switched to long-acting and short-acting insulin. Patient is doing well. Evaluated by diabetic educator and nursing staff to teach about diabetes and insulin injection. I also discussed with the patient and her husband in detail regarding signs and symptoms of hypo-and hyperglycemia. Also educated about importance of insulin and monitoring blood sugar level at home. Patient was educated to check blood sugar level at least 3 times a day before breakfast, before lunch and before breakfast and follow-up with PCP. Patient was found to have hyperglycemia in the hospital therefore the dose of insulin adjusted on discharge.  Patient clinically improved. Treated with potassium chloride for hypokalemia. Recommended to check labs with PCP in a week. She has history of  hypertension for which she takes amlodipine. I advised her to continue her home medications and follow up with PCP.  At this time, patient clinically improved. Denied fever, chills, headache, dizziness, chest pain, shortness of breath. Verbalized understanding of the diagnosis and medications. Discharge home in stable condition.  Discharge Diagnoses:  Active Problems:   Diabetic acidosis without coma [facility])    Discharge Instructions  Discharge Instructions    Call MD for:  difficulty breathing, headache or visual disturbances    Complete by:  As directed    Call MD for:  extreme fatigue    Complete by:  As directed    Call MD for:  hives    Complete by:  As directed    Call MD for:  persistant dizziness or light-headedness    Complete by:  As directed    Call MD for:  persistant nausea and vomiting    Complete by:  As directed    Call MD for:  severe uncontrolled pain    Complete by:  As directed    Call MD for:  temperature >100.4    Complete by:  As directed    DME Other see comment    Complete by:  As directed    Please provide: 1 glucometer, Test strips, lancets for one month supply with no refill.   Diet - low sodium heart healthy    Complete by:  As directed    Diet Carb Modified    Complete by:  As directed    Discharge instructions    Complete by:  As directed    Please monitor blood sugar level III times a day, before breakfast, before lunch and before dinner at home. Please write down the test result and follow-up with  her PCP in a week. -If you feel short of breath, lightheaded or dizziness, sweating or not feeling right. Check your blood sugar level. If your blood sugar level is less than 70 please take orange juice and recheck your blood sugar level. Please follow up with your PCP to monitor blood sugar level and possibly adjusting the insulin dose. -Please take low carb diet and diet low in sugar.   Increase activity slowly    Complete by:  As directed       Allergies as of 10/14/2016   No Known Allergies     Medication List    TAKE these medications   amLODipine 10 MG tablet Commonly known as:  NORVASC Take 1 tablet by mouth daily.   cholecalciferol 1000 units tablet Commonly known as:  VITAMIN D Take 1,000 Units by mouth daily.   insulin aspart 100 UNIT/ML FlexPen Commonly known as:  NOVOLOG FLEXPEN Inject 7 Units into the skin 3 (three) times daily with meals.   Insulin Detemir 100 UNIT/ML Pen Commonly known as:  LEVEMIR Inject 35 Units into the skin daily at 10 pm.   INSULIN SYRINGE .5CC/28G 28G X 1/2" 0.5 ML Misc 1 Units by Does not apply route 3 (three) times daily.   potassium chloride SA 20 MEQ tablet Commonly known as:  K-DUR,KLOR-CON Take 1 tablet (20 mEq total) by mouth daily.   ranitidine 150 MG tablet Commonly known as:  ZANTAC Take 1 tablet by mouth 2 (two) times daily.            Durable Medical Equipment        Start     Ordered   10/14/16 0000  DME Other see comment    Comments:  Please provide: 1 glucometer, Test strips, lancets for one month supply with no refill.   10/14/16 1227     Follow-up Information    Celedonio Savage, MD. Schedule an appointment as soon as possible for a visit in 1 week(s).   Specialty:  Family Medicine Contact information: [place] [phone]          No Known Allergies  Consultations: None  Procedures/Studies: None  Subjective: Patient was seen and examined at bedside. Reported doing much better. Verbalized understanding of insulin injection and diabetic education. Denied fever, chills, headache, dizziness, chest pain, shortness of breath, nausea or vomiting. Able to tolerate diet.   Discharge Exam: Vitals:   10/14/16 0555 10/14/16 0836  BP: 119/66 121/72  Pulse: 61 62  Resp: 15 16  Temp: 97.9 F (36.6 C) 98.1 F (36.7 C)   Vitals:   10/13/16 1300 10/13/16 2119 10/14/16 0555 10/14/16 0836  BP: 140/87 137/72 119/66  121/72  Pulse: 72 62 61 62  Resp: (!) 22 20 15 16   Temp:  98.1 F (36.7 C) 97.9 F (36.6 C) 98.1 F (36.7 C)  TempSrc:  Oral Oral Oral  SpO2: 94% 98% 95% 96%  Weight:      Height:        General: Pt is alert, awake, not in acute distress Cardiovascular: RRR, S1/S2 +, no rubs, no gallops Respiratory: CTA bilaterally, no wheezing, no rhonchi Abdominal: Soft, NT, ND, bowel sounds + Extremities: no edema, no cyanosis    The results of significant diagnostics from this hospitalization (including imaging, microbiology, ancillary and laboratory) are listed below for reference.     Microbiology: Recent Results (from the past 240 hour(s))  MRSA PCR Screening     Status:  None   Collection Time: 10/12/16  2:50 PM  Result Value Ref Range Status   MRSA by PCR NEGATIVE NEGATIVE Final    Comment:        The GeneXpert MRSA Assay (FDA approved for NASAL specimens only), is one component of a comprehensive MRSA colonization surveillance program. It is not intended to diagnose MRSA infection nor to guide or monitor treatment for MRSA infections.      Labs: BNP (last 3 results) No results for input(s): BNP in the last 8760 hours. Basic Metabolic Panel:  Recent Labs Lab 10/12/16 1522 10/12/16 1843 10/12/16 2254 10/13/16 0300 10/13/16 0656 10/14/16 0624  NA 147* 148* 148* 145 147* 145  K 3.8 4.2 3.4* 4.2 4.2 3.2*  CL 114* 117* 119* 117* 117* 114*  CO2 21* 26 24 22 24 25   GLUCOSE 433* 291* 172* 297* 328* 274*  BUN 18 14 12 12 12 12   CREATININE 0.92 0.72 0.62 0.62 0.70 0.68  CALCIUM 8.6* 8.5* 8.3* 8.2* 8.6* 8.5*  MG 1.8  --   --   --   --   --   PHOS 2.9  --   --   --   --   --    Liver Function Tests: No results for input(s): AST, ALT, ALKPHOS, BILITOT, PROT, ALBUMIN in the last 168 hours. No results for input(s): LIPASE, AMYLASE in the last 168 hours. No results for input(s): AMMONIA in the last 168 hours. CBC:  Recent Labs Lab 10/12/16 1105  WBC 11.4*   NEUTROABS 9.6*  HGB 15.2*  HCT 46.5*  MCV 94.1  PLT 177   Cardiac Enzymes: No results for input(s): CKTOTAL, CKMB, CKMBINDEX, TROPONINI in the last 168 hours. BNP: Invalid input(s): POCBNP CBG:  Recent Labs Lab 10/13/16 1203 10/13/16 1622 10/13/16 2117 10/14/16 0744 10/14/16 1114  GLUCAP 325* 349* 262* 274* 354*   D-Dimer No results for input(s): DDIMER in the last 72 hours. Hgb A1c  Recent Labs  10/12/16 1522  HGBA1C 12.2*   Lipid Profile No results for input(s): CHOL, HDL, LDLCALC, TRIG, CHOLHDL, LDLDIRECT in the last 72 hours. Thyroid function studies No results for input(s): TSH, T4TOTAL, T3FREE, THYROIDAB in the last 72 hours.  Invalid input(s): FREET3 Anemia work up No results for input(s): VITAMINB12, FOLATE, FERRITIN, TIBC, IRON, RETICCTPCT in the last 72 hours. Urinalysis    Component Value Date/Time   COLORURINE STRAW (A) 10/12/2016 1105   APPEARANCEUR CLEAR 10/12/2016 1105   LABSPEC 1.020 10/12/2016 1105   PHURINE 5.0 10/12/2016 1105   GLUCOSEU >=500 (A) 10/12/2016 1105   HGBUR NEGATIVE 10/12/2016 1105   BILIRUBINUR NEGATIVE 10/12/2016 1105   KETONESUR 80 (A) 10/12/2016 1105   PROTEINUR NEGATIVE 10/12/2016 1105   NITRITE NEGATIVE 10/12/2016 1105   LEUKOCYTESUR NEGATIVE 10/12/2016 1105   Sepsis Labs Invalid input(s): PROCALCITONIN,  WBC,  LACTICIDVEN Microbiology Recent Results (from the past 240 hour(s))  MRSA PCR Screening     Status: None   Collection Time: 10/12/16  2:50 PM  Result Value Ref Range Status   MRSA by PCR NEGATIVE NEGATIVE Final    Comment:        The GeneXpert MRSA Assay (FDA approved for NASAL specimens only), is one component of a comprehensive MRSA colonization surveillance program. It is not intended to diagnose MRSA infection nor to guide or monitor treatment for MRSA infections.      Time coordinating discharge: 27 minutes  SIGNED:   Rosita Fire, MD  [facility] 10/14/2016, 12:29  PM  If 7PM-7AM, please contact night-coverage [email or web address] Password [email or web address]

## 2016-10-14 NOTE — Progress Notes (Signed)
Patient with orders to be discharge home. Discharge instructions given, patient and spouse verbalized understanding. Diabetes education given. Patient demonstrated drawing up insulin and and doing self injections. Prescriptions given. Patient stable. Patient left in private vehicle with spouse.

## 2016-10-19 ENCOUNTER — Encounter: Payer: Self-pay | Admitting: Family Medicine

## 2016-10-19 ENCOUNTER — Ambulatory Visit (INDEPENDENT_AMBULATORY_CARE_PROVIDER_SITE_OTHER): Payer: 59 | Admitting: Family Medicine

## 2016-10-19 VITALS — BP 135/81 | HR 67 | Temp 97.6°F | Ht 61.0 in | Wt 219.4 lb

## 2016-10-19 DIAGNOSIS — E119 Type 2 diabetes mellitus without complications: Secondary | ICD-10-CM | POA: Insufficient documentation

## 2016-10-19 DIAGNOSIS — I10 Essential (primary) hypertension: Secondary | ICD-10-CM | POA: Diagnosis not present

## 2016-10-19 DIAGNOSIS — E1310 Other specified diabetes mellitus with ketoacidosis without coma: Secondary | ICD-10-CM

## 2016-10-19 DIAGNOSIS — E1110 Type 2 diabetes mellitus with ketoacidosis without coma: Secondary | ICD-10-CM

## 2016-10-19 MED ORDER — INSULIN DETEMIR 100 UNIT/ML FLEXPEN: 35.0000 [IU] | PEN_INJECTOR | Freq: Every day | SUBCUTANEOUS | 1 refills | Status: DC

## 2016-10-19 MED ORDER — BLOOD GLUCOSE TEST VI STRP: 1.0000 | ORAL_STRIP | Freq: Two times a day (BID) | 11 refills | Status: DC

## 2016-10-19 MED ORDER — INSULIN DETEMIR 100 UNIT/ML FLEXPEN
35.0000 [IU] | PEN_INJECTOR | Freq: Every day | SUBCUTANEOUS | 1 refills | Status: DC
Start: 2016-10-19 — End: 2017-03-26

## 2016-10-19 MED ORDER — INSULIN ASPART 100 UNIT/ML FLEXPEN: 7.0000 [IU] | PEN_INJECTOR | Freq: Three times a day (TID) | SUBCUTANEOUS | 1 refills | Status: DC

## 2016-10-19 MED ORDER — INSULIN PEN NEEDLE 31G X 5 MM MISC: 1.0000 | Freq: Four times a day (QID) | 11 refills | Status: DC

## 2016-10-19 MED ORDER — DAPAGLIFLOZIN PRO-METFORMIN ER 5-1000 MG PO TB24: 1.0000 | ORAL_TABLET | Freq: Two times a day (BID) | ORAL | 1 refills | Status: DC

## 2016-10-19 MED ORDER — INSULIN ASPART 100 UNIT/ML FLEXPEN
7.0000 [IU] | PEN_INJECTOR | Freq: Three times a day (TID) | SUBCUTANEOUS | 1 refills | Status: DC
Start: 2016-10-19 — End: 2016-11-28

## 2016-10-19 NOTE — Progress Notes (Signed)
BP 135/81   Pulse 67   Temp 97.6 F (36.4 C) (Oral)   Ht 5\' 1"  (1.549 m)   Wt 219 lb 6 oz (99.5 kg)   BMI 41.45 kg/m    Subjective:    Patient ID: Samantha Camacho, female    DOB: Sep 09, 1956, 61 y.o.   MRN: [id number]  HPI: Samantha Camacho is a 61 y.o. female presenting on 10/19/2016 for Hospital followup/Establish care (new diabetic, A1C 12.2)   HPI Hospital follow-up for new diabetes Patient was admitted to the hospital on 10/12/2016 in DKA with a new onset diabetes. She said she was having stomach issues and urinating frequency and having a lot of thirst. She still having some of that. Since the hospitalization they started her on Levemir 35 units at bedtime and NovoLog 7 units 3 times a day. She says her blood sugars are still running in the 200-300 range. She did not have any renal issues on any laboratory values from the hospital. Today she denies any issues or symptoms except the increased urination still a little and a little bit of left-sided abdominal pain that she is still having. The abdominal pain could also be that she has been constipated and not having regular bowel movement over the last few days.  Hypertension Patient also has a diagnosis of hypertension and has been on medications for this for some time. Her blood pressure is 135/81 and has been controlled on the amlodipine. She denies any issues with medication.  Relevant past medical, surgical, family and social history reviewed and updated as indicated. Interim medical history since our last visit reviewed. Allergies and medications reviewed and updated.  Review of Systems  Constitutional: Negative for chills and fever.  Respiratory: Negative for chest tightness and shortness of breath.   Cardiovascular: Negative for chest pain and leg swelling.  Gastrointestinal: Positive for abdominal pain. Negative for blood in stool, constipation, diarrhea, nausea and vomiting.  Genitourinary: Negative for difficulty  urinating and dysuria.  Musculoskeletal: Negative for back pain and gait problem.  Skin: Negative for rash.  Neurological: Negative for light-headedness and headaches.  Psychiatric/Behavioral: Negative for agitation and behavioral problems.  All other systems reviewed and are negative.   Per HPI unless specifically indicated above  Social History   Social History  . Marital status: Married    Spouse name: N/A  . Number of children: N/A  . Years of education: N/A   Occupational History  . Not on file.   Social History Main Topics  . Smoking status: Never Smoker  . Smokeless tobacco: Never Used  . Alcohol use Yes     Comment: rarely  . Drug use: No  . Sexual activity: Yes    Birth control/ protection: Surgical   Other Topics Concern  . Not on file   Social History Narrative  . No narrative on file    Past Surgical History:  Procedure Laterality Date  . ABDOMINAL HYSTERECTOMY    . CHOLECYSTECTOMY      Family History  Problem Relation Age of Onset  . Diabetes Mother   . Heart disease Mother   . Cancer Mother   . Diabetes Sister   . Diabetes Brother     Allergies as of 10/19/2016   No Known Allergies     Medication List       Accurate as of 10/19/16 10:29 AM. Always use your most recent med list.          amLODipine 10 MG tablet Commonly  known as:  NORVASC Take 1 tablet by mouth daily.   BLOOD GLUCOSE TEST STRIPS Strp 1 strip by In Vitro route 2 (two) times daily.   cholecalciferol 1000 units tablet Commonly known as:  VITAMIN D Take 1,000 Units by mouth daily.   Dapagliflozin-Metformin HCl ER 02-999 MG Tb24 Commonly known as:  XIGDUO XR Take 1 tablet by mouth 2 (two) times daily.   insulin aspart 100 UNIT/ML FlexPen Commonly known as:  NOVOLOG FLEXPEN Inject 7 Units into the skin 3 (three) times daily with meals.   Insulin Detemir 100 UNIT/ML Pen Commonly known as:  LEVEMIR Inject 35 Units into the skin daily at 10 pm.   Insulin Pen  Needle 31G X 5 MM Misc Commonly known as:  B-D UF III MINI PEN NEEDLES 1 each by Does not apply route 4 (four) times daily.   INSULIN SYRINGE .5CC/28G 28G X 1/2" 0.5 ML Misc 1 Units by Does not apply route 3 (three) times daily.   potassium chloride SA 20 MEQ tablet Commonly known as:  K-DUR,KLOR-CON Take 1 tablet (20 mEq total) by mouth daily.   ranitidine 150 MG tablet Commonly known as:  ZANTAC Take 1 tablet by mouth 2 (two) times daily.          Objective:    BP 135/81   Pulse 67   Temp 97.6 F (36.4 C) (Oral)   Ht 5\' 1"  (1.549 m)   Wt 219 lb 6 oz (99.5 kg)   BMI 41.45 kg/m   Wt Readings from Last 3 Encounters:  10/19/16 219 lb 6 oz (99.5 kg)  10/12/16 210 lb 12.2 oz (95.6 kg)    Physical Exam  Constitutional: She is oriented to person, place, and time. She appears well-developed and well-nourished. No distress.  Eyes: Conjunctivae are normal.  Cardiovascular: Normal rate, regular rhythm, normal heart sounds and intact distal pulses.   No murmur heard. Pulmonary/Chest: Effort normal and breath sounds normal. No respiratory distress. She has no wheezes.  Abdominal: Soft. Bowel sounds are normal. She exhibits no distension. There is tenderness (Mild left-sided abdominal pain tenderness, no CVA tenderness). There is no rebound and no guarding.  Musculoskeletal: Normal range of motion. She exhibits no edema or tenderness.  Neurological: She is alert and oriented to person, place, and time. Coordination normal.  Skin: Skin is warm and dry. No rash noted. She is not diaphoretic.  Psychiatric: She has a normal mood and affect. Her behavior is normal.  Nursing note and vitals reviewed.  Diabetic Foot Exam - Simple   Simple Foot Form Diabetic Foot exam was performed with the following findings:  Yes 10/19/2016 10:37 AM  Visual Inspection No deformities, no ulcerations, no other skin breakdown bilaterally:  Yes Sensation Testing Intact to touch and monofilament testing  bilaterally:  Yes Pulse Check Posterior Tibialis and Dorsalis pulse intact bilaterally:  Yes Comments Patient does have some hemosiderin deposits      Results for orders placed or performed during the hospital encounter of 10/12/16  MRSA PCR Screening  Result Value Ref Range   MRSA by PCR NEGATIVE NEGATIVE  CBC with Differential/Platelet  Result Value Ref Range   WBC 11.4 (H) 4.0 - 10.5 K/uL   RBC 4.94 3.87 - 5.11 MIL/uL   Hemoglobin 15.2 (H) 12.0 - 15.0 g/dL   HCT 46.5 (H) 36.0 - 46.0 %   MCV 94.1 78.0 - 100.0 fL   MCH 30.8 26.0 - 34.0 pg   MCHC 32.7 30.0 - 36.0 g/dL  RDW 14.2 11.5 - 15.5 %   Platelets 177 150 - 400 K/uL   Neutrophils Relative % 84 %   Neutro Abs 9.6 (H) 1.7 - 7.7 K/uL   Lymphocytes Relative 12 %   Lymphs Abs 1.4 0.7 - 4.0 K/uL   Monocytes Relative 3 %   Monocytes Absolute 0.4 0.1 - 1.0 K/uL   Eosinophils Relative 0 %   Eosinophils Absolute 0.0 0.0 - 0.7 K/uL   Basophils Relative 0 %   Basophils Absolute 0.1 0.0 - 0.1 K/uL  Basic metabolic panel  Result Value Ref Range   Sodium 142 135 - 145 mmol/L   Potassium 4.0 3.5 - 5.1 mmol/L   Chloride 101 101 - 111 mmol/L   CO2 22 22 - 32 mmol/L   Glucose, Bld 667 (HH) 65 - 99 mg/dL   BUN 22 (H) 6 - 20 mg/dL   Creatinine, Ser 1.14 (H) 0.44 - 1.00 mg/dL   Calcium 9.7 8.9 - 10.3 mg/dL   GFR calc non Af Amer 51 (L) >60 mL/min   GFR calc Af Amer 59 (L) >60 mL/min   Anion gap 19 (H) 5 - 15  Urinalysis, Routine w reflex microscopic  Result Value Ref Range   Color, Urine STRAW (A) YELLOW   APPearance CLEAR CLEAR   Specific Gravity, Urine 1.020 1.005 - 1.030   pH 5.0 5.0 - 8.0   Glucose, UA >=500 (A) NEGATIVE mg/dL   Hgb urine dipstick NEGATIVE NEGATIVE   Bilirubin Urine NEGATIVE NEGATIVE   Ketones, ur 80 (A) NEGATIVE mg/dL   Protein, ur NEGATIVE NEGATIVE mg/dL   Nitrite NEGATIVE NEGATIVE   Leukocytes, UA NEGATIVE NEGATIVE   RBC / HPF 0-5 0 - 5 RBC/hpf   WBC, UA 0-5 0 - 5 WBC/hpf   Bacteria, UA NONE SEEN  NONE SEEN   Mucous PRESENT    Hyaline Casts, UA PRESENT   Blood gas, venous  Result Value Ref Range   FIO2 21.00    Mode ROOM AIR    pH, Ven 7.322 7.250 - 7.430   pCO2, Ven 43.1 (L) 44.0 - 60.0 mmHg   pO2, Ven 44.8 32.0 - 45.0 mmHg   Bicarbonate 20.7 20.0 - 28.0 mmol/L   Acid-base deficit 3.4 (H) 0.0 - 2.0 mmol/L   O2 Saturation 74.3 %   Collection site VEIN    Drawn by NURSE    Sample type VEIN   Glucose, capillary  Result Value Ref Range   Glucose-Capillary 360 (H) 65 - 99 mg/dL  Basic metabolic panel  Result Value Ref Range   Sodium 147 (H) 135 - 145 mmol/L   Potassium 3.8 3.5 - 5.1 mmol/L   Chloride 114 (H) 101 - 111 mmol/L   CO2 21 (L) 22 - 32 mmol/L   Glucose, Bld 433 (H) 65 - 99 mg/dL   BUN 18 6 - 20 mg/dL   Creatinine, Ser 0.92 0.44 - 1.00 mg/dL   Calcium 8.6 (L) 8.9 - 10.3 mg/dL   GFR calc non Af Amer >60 >60 mL/min   GFR calc Af Amer >60 >60 mL/min   Anion gap 12 5 - 15  Basic metabolic panel  Result Value Ref Range   Sodium 148 (H) 135 - 145 mmol/L   Potassium 4.2 3.5 - 5.1 mmol/L   Chloride 117 (H) 101 - 111 mmol/L   CO2 26 22 - 32 mmol/L   Glucose, Bld 291 (H) 65 - 99 mg/dL   BUN 14 6 - 20 mg/dL  Creatinine, Ser 0.72 0.44 - 1.00 mg/dL   Calcium 8.5 (L) 8.9 - 10.3 mg/dL   GFR calc non Af Amer >60 >60 mL/min   GFR calc Af Amer >60 >60 mL/min   Anion gap 5 5 - 15  Basic metabolic panel  Result Value Ref Range   Sodium 148 (H) 135 - 145 mmol/L   Potassium 3.4 (L) 3.5 - 5.1 mmol/L   Chloride 119 (H) 101 - 111 mmol/L   CO2 24 22 - 32 mmol/L   Glucose, Bld 172 (H) 65 - 99 mg/dL   BUN 12 6 - 20 mg/dL   Creatinine, Ser 0.62 0.44 - 1.00 mg/dL   Calcium 8.3 (L) 8.9 - 10.3 mg/dL   GFR calc non Af Amer >60 >60 mL/min   GFR calc Af Amer >60 >60 mL/min   Anion gap 5 5 - 15  Basic metabolic panel  Result Value Ref Range   Sodium 145 135 - 145 mmol/L   Potassium 4.2 3.5 - 5.1 mmol/L   Chloride 117 (H) 101 - 111 mmol/L   CO2 22 22 - 32 mmol/L   Glucose, Bld  297 (H) 65 - 99 mg/dL   BUN 12 6 - 20 mg/dL   Creatinine, Ser 0.62 0.44 - 1.00 mg/dL   Calcium 8.2 (L) 8.9 - 10.3 mg/dL   GFR calc non Af Amer >60 >60 mL/min   GFR calc Af Amer >60 >60 mL/min   Anion gap 6 5 - 15  Magnesium  Result Value Ref Range   Magnesium 1.8 1.7 - 2.4 mg/dL  Phosphorus  Result Value Ref Range   Phosphorus 2.9 2.5 - 4.6 mg/dL  Hemoglobin A1c  Result Value Ref Range   Hgb A1c MFr Bld 12.2 (H) 4.8 - 5.6 %   Mean Plasma Glucose 303 mg/dL  Glucose, capillary  Result Value Ref Range   Glucose-Capillary 337 (H) 65 - 99 mg/dL   Comment 1 Notify RN    Comment 2 Document in Chart   Glucose, capillary  Result Value Ref Range   Glucose-Capillary 283 (H) 65 - 99 mg/dL   Comment 1 Notify RN    Comment 2 Document in Chart   Glucose, capillary  Result Value Ref Range   Glucose-Capillary 255 (H) 65 - 99 mg/dL   Comment 1 Notify RN    Comment 2 Document in Chart   Glucose, capillary  Result Value Ref Range   Glucose-Capillary 231 (H) 65 - 99 mg/dL   Comment 1 Notify RN    Comment 2 Document in Chart   Basic metabolic panel  Result Value Ref Range   Sodium 147 (H) 135 - 145 mmol/L   Potassium 4.2 3.5 - 5.1 mmol/L   Chloride 117 (H) 101 - 111 mmol/L   CO2 24 22 - 32 mmol/L   Glucose, Bld 328 (H) 65 - 99 mg/dL   BUN 12 6 - 20 mg/dL   Creatinine, Ser 0.70 0.44 - 1.00 mg/dL   Calcium 8.6 (L) 8.9 - 10.3 mg/dL   GFR calc non Af Amer >60 >60 mL/min   GFR calc Af Amer >60 >60 mL/min   Anion gap 6 5 - 15  Glucose, capillary  Result Value Ref Range   Glucose-Capillary 224 (H) 65 - 99 mg/dL  Glucose, capillary  Result Value Ref Range   Glucose-Capillary 197 (H) 65 - 99 mg/dL  Glucose, capillary  Result Value Ref Range   Glucose-Capillary 188 (H) 65 - 99 mg/dL  Comment 1 Notify RN    Comment 2 Document in Chart   Glucose, capillary  Result Value Ref Range   Glucose-Capillary 139 (H) 65 - 99 mg/dL  Glucose, capillary  Result Value Ref Range   Glucose-Capillary  177 (H) 65 - 99 mg/dL  Glucose, capillary  Result Value Ref Range   Glucose-Capillary 242 (H) 65 - 99 mg/dL  Glucose, capillary  Result Value Ref Range   Glucose-Capillary 303 (H) 65 - 99 mg/dL   Comment 1 Notify RN    Comment 2 Document in Chart   Glucose, capillary  Result Value Ref Range   Glucose-Capillary 325 (H) 65 - 99 mg/dL   Comment 1 Notify RN    Comment 2 Document in Chart   Glucose, capillary  Result Value Ref Range   Glucose-Capillary 349 (H) 65 - 99 mg/dL   Comment 1 Notify RN    Comment 2 Document in Chart   Basic metabolic panel  Result Value Ref Range   Sodium 145 135 - 145 mmol/L   Potassium 3.2 (L) 3.5 - 5.1 mmol/L   Chloride 114 (H) 101 - 111 mmol/L   CO2 25 22 - 32 mmol/L   Glucose, Bld 274 (H) 65 - 99 mg/dL   BUN 12 6 - 20 mg/dL   Creatinine, Ser 0.68 0.44 - 1.00 mg/dL   Calcium 8.5 (L) 8.9 - 10.3 mg/dL   GFR calc non Af Amer >60 >60 mL/min   GFR calc Af Amer >60 >60 mL/min   Anion gap 6 5 - 15  Glucose, capillary  Result Value Ref Range   Glucose-Capillary 262 (H) 65 - 99 mg/dL   Comment 1 Notify RN    Comment 2 Document in Chart   Glucose, capillary  Result Value Ref Range   Glucose-Capillary 274 (H) 65 - 99 mg/dL   Comment 1 Notify RN    Comment 2 Document in Chart   Glucose, capillary  Result Value Ref Range   Glucose-Capillary 354 (H) 65 - 99 mg/dL   Comment 1 Notify RN    Comment 2 Document in Chart   CBG monitoring, ED  Result Value Ref Range   Glucose-Capillary >600 (HH) 65 - 99 mg/dL   Comment 1 Notify RN   CBG monitoring, ED  Result Value Ref Range   Glucose-Capillary 482 (H) 65 - 99 mg/dL  CBG monitoring, ED  Result Value Ref Range   Glucose-Capillary 417 (H) 65 - 99 mg/dL   Comment 1 Glucose Stabilizer       Assessment & Plan:   Problem List Items Addressed This Visit      Cardiovascular and Mediastinum   Essential hypertension     Endocrine   Type 2 diabetes mellitus ([place]) - Primary    Patient is a new type II  diabetic, diagnosed when she was in DKA in the hospital. She is currently on Levemir 35 units at bedtime and NovoLog 7 units 3 times a day and get blood sugars in the 200 to low 300 range. We'll start her on Xigduo. We will monitor blood sugars closely and set her up with Cherre Robins PhD for education.      Relevant Medications   Dapagliflozin-Metformin HCl ER (XIGDUO XR) 02-999 MG TB24   Insulin Detemir (LEVEMIR) 100 UNIT/ML Pen   insulin aspart (NOVOLOG FLEXPEN) 100 UNIT/ML FlexPen   Insulin Pen Needle (B-D UF III MINI PEN NEEDLES) 31G X 5 MM MISC   Glucose Blood (BLOOD GLUCOSE TEST STRIPS)  STRP       Follow up plan: Return in about 2 months (around 12/17/2016), or if symptoms worsen or fail to improve, for Needs appointment with Cherre Robins PhD, return to see me in 2 months.  Caryl Pina, MD [facility] 10/19/2016, 10:29 AM

## 2016-10-19 NOTE — Assessment & Plan Note (Signed)
Patient is a new type II diabetic, diagnosed when she was in DKA in the hospital. She is currently on Levemir 35 units at bedtime and NovoLog 7 units 3 times a day and get blood sugars in the 200 to low 300 range. We'll start her on Xigduo. We will monitor blood sugars closely and set her up with Cherre Robins PhD for education.

## 2016-10-19 NOTE — Addendum Note (Signed)
Addended by: Michaela Corner on: 10/19/2016 11:02 AM   Modules accepted: Orders

## 2016-10-20 DIAGNOSIS — H5213 Myopia, bilateral: Secondary | ICD-10-CM | POA: Diagnosis not present

## 2016-10-20 DIAGNOSIS — H524 Presbyopia: Secondary | ICD-10-CM | POA: Diagnosis not present

## 2016-10-20 DIAGNOSIS — H52222 Regular astigmatism, left eye: Secondary | ICD-10-CM | POA: Diagnosis not present

## 2016-10-20 LAB — HM DIABETES EYE EXAM

## 2016-10-23 ENCOUNTER — Telehealth: Payer: Self-pay | Admitting: Family Medicine

## 2016-10-23 NOTE — Telephone Encounter (Signed)
Spouse is concerned about wife taking Merleen Nicely He states pt was recently hospitalized for several of the side effects that the medication causes. Please advise

## 2016-10-26 ENCOUNTER — Ambulatory Visit: Payer: 59 | Admitting: Pharmacist

## 2016-10-26 NOTE — Telephone Encounter (Signed)
She was in the hospital for DKA and along with that had increased urination and thirst I'm sure that is what she is concerned about but that should improve because the blood sugars will be more under control. The urination may still be slightly increased though. If they have any other questions I will call them when where back in the office tomorrow.

## 2016-10-26 NOTE — Telephone Encounter (Signed)
Aware and will schedule an appointment.

## 2016-11-13 ENCOUNTER — Encounter: Payer: Self-pay | Admitting: Pharmacist

## 2016-11-13 ENCOUNTER — Ambulatory Visit (INDEPENDENT_AMBULATORY_CARE_PROVIDER_SITE_OTHER): Payer: 59 | Admitting: Pharmacist

## 2016-11-13 VITALS — Ht 61.0 in | Wt 218.0 lb

## 2016-11-13 DIAGNOSIS — Z794 Long term (current) use of insulin: Secondary | ICD-10-CM | POA: Diagnosis not present

## 2016-11-13 DIAGNOSIS — E119 Type 2 diabetes mellitus without complications: Secondary | ICD-10-CM

## 2016-11-13 MED ORDER — METFORMIN HCL 500 MG PO TABS: 500.0000 mg | ORAL_TABLET | Freq: Two times a day (BID) | ORAL | 1 refills | Status: DC

## 2016-11-13 NOTE — Patient Instructions (Addendum)
Start metformin '500mg'$  - take 1 tablet twice a day Decrease Novolog to 4 units prior to each meal for 1 week, then stop Novolog - call if blood glucose begins to increase to over 150 in morning or over 200 after meals. Continue to take Levemir 35 units at bedtime.   Diabetes and Standards of Medical Care   Diabetes is complicated. You may find that your diabetes team includes a dietitian, nurse, diabetes educator, eye doctor, and more. To help everyone know what is going on and to help you get the care you deserve, the following schedule of care was developed to help keep you on track. Below are the tests, exams, vaccines, medicines, education, and plans you will need.  Blood Glucose Goals Prior to meals = 80 - 130 Within 2 hours of the start of a meal = less than 180  HbA1c test (goal is less than 7.0% - your last value was 12.2 %) This test shows how well you have controlled your glucose over the past 2 to 3 months. It is used to see if your diabetes management plan needs to be adjusted.   It is performed at least 2 times a year if you are meeting treatment goals.  It is performed 4 times a year if therapy has changed or if you are not meeting treatment goals.  Blood pressure test  This test is performed at every routine medical visit. The goal is less than 140/90 mmHg for most people, but 130/80 mmHg in some cases. Ask your health care provider about your goal.  Dental exam  Follow up with the dentist regularly.  Eye exam  If you are diagnosed with type 1 diabetes as a child, get an exam upon reaching the age of 43 years or older and have had diabetes for 3 to 5 years. Yearly eye exams are recommended after that initial eye exam.  If you are diagnosed with type 1 diabetes as an adult, get an exam within 5 years of diagnosis and then yearly.  If you are diagnosed with type 2 diabetes, get an exam as soon as possible after the diagnosis and then yearly.  Foot care exam  Visual  foot exams are performed at every routine medical visit. The exams check for cuts, injuries, or other problems with the feet.  A comprehensive foot exam should be done yearly. This includes visual inspection as well as assessing foot pulses and testing for loss of sensation.  Check your feet nightly for cuts, injuries, or other problems with your feet. Tell your health care provider if anything is not healing.  Kidney function test (urine microalbumin)  This test is performed once a year.  Type 1 diabetes: The first test is performed 5 years after diagnosis.  Type 2 diabetes: The first test is performed at the time of diagnosis.  A serum creatinine and estimated glomerular filtration rate (eGFR) test is done once a year to assess the level of chronic kidney disease (CKD), if present.  Lipid profile (cholesterol, HDL, LDL, triglycerides)  Performed every 5 years for most people.  The goal for LDL is less than 100 mg/dL. If you are at high risk, the goal is less than 70 mg/dL.  The goal for HDL is 40 mg/dL to 50 mg/dL for men and 50 mg/dL to 60 mg/dL for women. An HDL cholesterol of 60 mg/dL or higher gives some protection against heart disease.  The goal for triglycerides is less than 150 mg/dL.  Influenza  vaccine, pneumococcal vaccine, and hepatitis B vaccine  The influenza vaccine is recommended yearly.  The pneumococcal vaccine is generally given once in a lifetime. However, there are some instances when another vaccination is recommended. Check with your health care provider.  The hepatitis B vaccine is also recommended for adults with diabetes.  Diabetes self-management education  Education is recommended at diagnosis and ongoing as needed.  Treatment plan  Your treatment plan is reviewed at every medical visit.  Document Released: 07/23/2009 Document Revised: 05/28/2013 Document Reviewed: 02/25/2013 [facility] Patient Information 2014 [place].

## 2016-11-13 NOTE — Progress Notes (Signed)
Patient ID: Samantha Camacho, female   DOB: [birth year], 61 y.o.   MRN: [id number]   Subjective:    Samantha Camacho is a 61 y.o. female who presents for an initial evaluation of Type 2 diabetes mellitus.  Samantha Camacho was hospitalized 10/12/2016 for DKA.  She was not previously diagnosed with DM. In hospital started Levemir 35 units qhs and Novolog 7 units prior to each meal.  At first appt with Dr Dettinger he prescirbed Merleen Nicely but patient has not stated because of risk of DKA with [place] portion of Xigduo.  Prior to hospitalization patient ahs lost 24lbs in about 2 months and was having polyuria and polydipsia.  These symptoms have resolved over the last 3 weeks.   The patient was initially diagnosed with Type 2 diabetes mellitus based A1c of 12.2% (10/12/2016) and BG of 667 (10/12/2016)  Her husband is present with her and he has type 2 DM.  She also have significant family history of DM - mother, brother and sister  Known diabetic complications: none Cardiovascular risk factors: diabetes mellitus, obesity (BMI >= 30 kg/m2) and sedentary lifestyle  Eye exam current (within one year): unknown Weight trend: stable Prior visit with CDE: no Current diet: in general, an "unhealthy" diet Current exercise: none Medication Compliance?  Yes  Current monitoring regimen: home blood tests - 3-4 times daily Home blood sugar records:     1 week following hospitalization BG ranged from 110 to 398  Last week her BG ranged from 78 - 144 Any episodes of hypoglycemia? no  Is She on ACE inhibitor or angiotensin II receptor blocker?  No        Objective:    Ht 5\' 1"  (1.549 m)   Wt 218 lb (98.9 kg)   BMI 41.19 kg/m   A1c = 12.2% (10/12/2016)  Lab Review Glucose, Bld (mg/dL)  Date Value  10/14/2016 274 (H)  10/13/2016 328 (H)  10/13/2016 297 (H)   CO2 (mmol/L)  Date Value  10/14/2016 25  10/13/2016 24  10/13/2016 22   BUN (mg/dL)  Date Value  10/14/2016 12  10/13/2016 12   10/13/2016 12   Creatinine, Ser (mg/dL)  Date Value  10/14/2016 0.68  10/13/2016 0.70  10/13/2016 0.62     Assessment:    Diabetes Mellitus type II, under improving control.    Plan:    1.  Rx changes:     Decrease Novolog to 4 units prior to meals for 1 week, then stop  Continue Levemir 35 units qhs  Start metformin 500mg  take 1 tablet bid with food   Plan is to increase metformin as tolerated and decrease Levemir - eventually change to [facility] as patient feels more comfortable about risk of DKA or to GLP-1 agonist 2.  Education: Reviewed 'ABCs' of diabetes management (respective goals in parentheses):  A1C (<7), blood pressure (<130/80), and cholesterol (LDL <100). 3. Discussed pathophysiology of DM; difference between type 1 and type 2 DM. 4. CHO counting diet discussed.  Reviewed CHO amount in various foods and how to read nutrition labels.  Discussed recommended serving sizes.  5.  Recommend check BG 3  times a day 6.  Recommended increase physical activity - goal is 150 minutes per week 7. Follow up: 1 month with PCP

## 2016-11-28 ENCOUNTER — Encounter (HOSPITAL_COMMUNITY): Payer: Self-pay

## 2016-11-28 ENCOUNTER — Emergency Department (HOSPITAL_COMMUNITY)
Admission: EM | Admit: 2016-11-28 | Discharge: 2016-11-28 | Disposition: A | Discharge status: Home / Self Care | Payer: 59 | Attending: Emergency Medicine | Admitting: Emergency Medicine

## 2016-11-28 DIAGNOSIS — E119 Type 2 diabetes mellitus without complications: Secondary | ICD-10-CM | POA: Insufficient documentation

## 2016-11-28 DIAGNOSIS — T383X1A Poisoning by insulin and oral hypoglycemic [antidiabetic] drugs, accidental (unintentional), initial encounter: Secondary | ICD-10-CM | POA: Diagnosis not present

## 2016-11-28 DIAGNOSIS — Z794 Long term (current) use of insulin: Secondary | ICD-10-CM | POA: Insufficient documentation

## 2016-11-28 DIAGNOSIS — I10 Essential (primary) hypertension: Secondary | ICD-10-CM | POA: Diagnosis not present

## 2016-11-28 DIAGNOSIS — Z79899 Other long term (current) drug therapy: Secondary | ICD-10-CM | POA: Diagnosis not present

## 2016-11-28 LAB — CBC WITH DIFFERENTIAL/PLATELET
BASOS ABS: 0 10*3/uL (ref 0.0–0.1)
BASOS PCT: 0 %
Eosinophils Absolute: 0.3 10*3/uL (ref 0.0–0.7)
Eosinophils Relative: 4 %
HEMATOCRIT: 37.2 % (ref 36.0–46.0)
HEMOGLOBIN: 12.4 g/dL (ref 12.0–15.0)
Lymphocytes Relative: 26 %
Lymphs Abs: 2.4 10*3/uL (ref 0.7–4.0)
MCH: 30.5 pg (ref 26.0–34.0)
MCHC: 33.3 g/dL (ref 30.0–36.0)
MCV: 91.4 fL (ref 78.0–100.0)
MONO ABS: 0.6 10*3/uL (ref 0.1–1.0)
Monocytes Relative: 7 %
NEUTROS ABS: 5.9 10*3/uL (ref 1.7–7.7)
NEUTROS PCT: 63 %
Platelets: 214 10*3/uL (ref 150–400)
RBC: 4.07 MIL/uL (ref 3.87–5.11)
RDW: 14.1 % (ref 11.5–15.5)
WBC: 9.3 10*3/uL (ref 4.0–10.5)

## 2016-11-28 LAB — BASIC METABOLIC PANEL
ANION GAP: 9 (ref 5–15)
BUN: 16 mg/dL (ref 6–20)
CALCIUM: 9.4 mg/dL (ref 8.9–10.3)
CO2: 23 mmol/L (ref 22–32)
Chloride: 110 mmol/L (ref 101–111)
Creatinine, Ser: 0.55 mg/dL (ref 0.44–1.00)
GFR calc non Af Amer: >60 mL/min (ref >60)
Glucose, Bld: 106 mg/dL — ABNORMAL HIGH (ref 65–99)
Potassium: 3.1 mmol/L — ABNORMAL LOW (ref 3.5–5.1)
SODIUM: 142 mmol/L (ref 135–145)

## 2016-11-28 LAB — CBG MONITORING, ED
GLUCOSE-CAPILLARY: 77 mg/dL (ref 65–99)
Glucose-Capillary: 100 mg/dL — ABNORMAL HIGH (ref 65–99)
Glucose-Capillary: 105 mg/dL — ABNORMAL HIGH (ref 65–99)
Glucose-Capillary: 146 mg/dL — ABNORMAL HIGH (ref 65–99)
Glucose-Capillary: 89 mg/dL (ref 65–99)

## 2016-11-28 NOTE — Discharge Instructions (Signed)
BE SURE TO CHECK YOUR SUGAR FREQUENTLY TODAY IF YOU FEEL WEAK/DIZZY/SWEATY, BE SURE TO DRINK JUICE OR EAT CANDY  HOLD ALL DIABETICS MEDS UNTIL TONIGHT AND BE SURE TO CHECK YOUR SUGAR PRIOR TO TAKING ANY MEDICATIONS

## 2016-11-28 NOTE — ED Notes (Signed)
Pt given orange juice and peanut butter crackers.  

## 2016-11-28 NOTE — ED Triage Notes (Addendum)
Patient states that she accidentally took her Novolog tonight, took 35 units, instead of her Levemir.  She mixed up her insulin pens.  Took the insulin around midnight and blood sugar was 101, and it dropped to 80 around 20 minutes later.  Patient has been drinking Dr. Malachi Bonds and eating candy on the way to the hospital to keep it up.  CBG 105 on arrival to the ER.

## 2016-11-28 NOTE — ED Notes (Signed)
Pt given ice cream, a cookie and coke.

## 2016-11-28 NOTE — ED Provider Notes (Signed)
[facility] DEPT Provider Note   CSN: [id number] Arrival date & time: 11/28/16  0047     History   Chief Complaint Chief Complaint  Patient presents with  . Drug Overdose    HPI Samantha Camacho is a 61 y.o. female.  The history is provided by the patient and the spouse.  Drug Overdose  This is a new problem. The current episode started 1 to 2 hours ago. The problem occurs constantly. The problem has been gradually improving. Pertinent negatives include no chest pain, no abdominal pain, no headaches and no shortness of breath. Nothing aggravates the symptoms. Nothing relieves the symptoms.  Patient presents with accidental overdose of insulin She reports she took 35 units of novolog at around 12am She reports recently was switched from novolog and had stopped taking it, but tonight she grabbed it out fridge instead of her levemir She also takes metformin daily No other oral diabetic meds No other ingestions reported She denies cp/sob/weakness at this time   She has been taking PO soda and candy  Past Medical History:  Diagnosis Date  . Diabetes mellitus without complication ([place])   . Hypertension     Patient Active Problem List   Diagnosis Date Noted  . Type 2 diabetes mellitus ([place]) 10/19/2016  . Hypokalemia   . Diabetic acidosis without coma ([place]) 10/12/2016  . Essential hypertension     Past Surgical History:  Procedure Laterality Date  . ABDOMINAL HYSTERECTOMY    . CHOLECYSTECTOMY      OB History    No data available       Home Medications    Prior to Admission medications   Medication Sig Start Date End Date Taking? Authorizing Provider  amLODipine (NORVASC) 10 MG tablet Take 1 tablet by mouth daily. 09/13/16   Historical Provider, MD  cholecalciferol (VITAMIN D) 1000 units tablet Take 1,000 Units by mouth daily.    Historical Provider, MD  Glucose Blood (BLOOD GLUCOSE TEST STRIPS) STRP 1 strip by In Vitro route 2 (two) times daily. 10/19/16    Fransisca Kaufmann Dettinger, MD  insulin aspart (NOVOLOG FLEXPEN) 100 UNIT/ML FlexPen Inject 7 Units into the skin 3 (three) times daily with meals. 10/19/16   Fransisca Kaufmann Dettinger, MD  Insulin Detemir (LEVEMIR) 100 UNIT/ML Pen Inject 35 Units into the skin daily at 10 pm. 10/19/16   Fransisca Kaufmann Dettinger, MD  Insulin Pen Needle (B-D UF III MINI PEN NEEDLES) 31G X 5 MM MISC 1 each by Does not apply route 4 (four) times daily. 10/19/16   Fransisca Kaufmann Dettinger, MD  INSULIN SYRINGE .5CC/28G 28G X 1/2" 0.5 ML MISC 1 Units by Does not apply route 3 (three) times daily. 10/14/16   Dron Tanna Furry, MD  metFORMIN (GLUCOPHAGE) 500 MG tablet Take 1 tablet (500 mg total) by mouth 2 (two) times daily with a meal. 11/13/16   Cherre Robins, PharmD  potassium chloride SA (K-DUR,KLOR-CON) 20 MEQ tablet Take 1 tablet (20 mEq total) by mouth daily. 10/14/16   Dron Tanna Furry, MD  ranitidine (ZANTAC) 150 MG tablet Take 1 tablet by mouth 2 (two) times daily. 09/13/16   Historical Provider, MD    Family History Family History  Problem Relation Age of Onset  . Diabetes Mother   . Heart disease Mother   . Cancer Mother   . Diabetes Sister   . Diabetes Brother     Social History Social History  Substance Use Topics  . Smoking status: Never Smoker  . Smokeless  tobacco: Never Used  . Alcohol use Yes     Comment: rarely     Allergies   Patient has no known allergies.   Review of Systems Review of Systems  Constitutional: Negative for chills, diaphoresis and fever.  Respiratory: Negative for shortness of breath.   Cardiovascular: Negative for chest pain.  Gastrointestinal: Negative for abdominal pain.  Neurological: Negative for weakness and headaches.  All other systems reviewed and are negative.    Physical Exam Updated Vital Signs BP 149/81 (BP Location: Left Arm)   Pulse 85   Temp 98 F (36.7 C) (Oral)   Resp 17   Ht 5\' 1"  (1.549 m)   Wt 98.9 kg   SpO2 97%   BMI 41.19 kg/m   Physical  Exam CONSTITUTIONAL: Well developed/well nourished HEAD: Normocephalic/atraumatic EYES: EOMI/PERRL ENMT: Mucous membranes moist NECK: supple no meningeal signs SPINE/BACK:entire spine nontender CV: S1/S2 noted, no murmurs/rubs/gallops noted LUNGS: Lungs are clear to auscultation bilaterally, no apparent distress ABDOMEN: soft, nontender, no rebound or guarding, bowel sounds noted throughout abdomen GU:no cva tenderness NEURO: Pt is awake/alert/appropriate, moves all extremitiesx4.  No facial droop.   EXTREMITIES: pulses normal/equal, full ROM SKIN: warm, color normal PSYCH: no abnormalities of mood noted, alert and oriented to situation   ED Treatments / Results  Labs (all labs ordered are listed, but only abnormal results are displayed) Labs Reviewed  BASIC METABOLIC PANEL - Abnormal; Notable for the following:       Result Value   Potassium 3.1 (*)    Glucose, Bld 106 (*)    All other components within normal limits  CBG MONITORING, ED - Abnormal; Notable for the following:    Glucose-Capillary 105 (*)    All other components within normal limits  CBG MONITORING, ED - Abnormal; Notable for the following:    Glucose-Capillary 146 (*)    All other components within normal limits  CBG MONITORING, ED - Abnormal; Notable for the following:    Glucose-Capillary 100 (*)    All other components within normal limits  CBC WITH DIFFERENTIAL/PLATELET  CBG MONITORING, ED  CBG MONITORING, ED    EKG  EKG Interpretation None       Radiology No results found.  Procedures Procedures (including critical care time)  Medications Ordered in ED Medications - No data to display   Initial Impression / Assessment and Plan / ED Course  I have reviewed the triage vital signs and the nursing notes.  Pertinent labs  results that were available during my care of the patient were reviewed by me and considered in my medical decision making (see chart for details).     2:28 AM Pt  stable Here with accidental use of novolog Will monitor glucose 5:16 AM Pt improved No significant drops in glucose She is well appearing She has no complaints Will d/c home She will hold all diabetic meds until this evening.  She is to check her glucose frequently and advised NOT to take any of her meds if her glucose is trending down again  Final Clinical Impressions(s) / ED Diagnoses   Final diagnoses:  Insulin overdose, accidental or unintentional, initial encounter    New Prescriptions New Prescriptions   No medications on file     Ripley Fraise, MD 11/28/16 [phone]

## 2016-11-28 NOTE — ED Notes (Signed)
Patient was given orange juice, coke and peanut butter crackers to increased blood sugar.

## 2016-12-12 ENCOUNTER — Encounter: Payer: Self-pay | Admitting: Family Medicine

## 2016-12-12 ENCOUNTER — Ambulatory Visit (INDEPENDENT_AMBULATORY_CARE_PROVIDER_SITE_OTHER): Payer: 59 | Admitting: Family Medicine

## 2016-12-12 VITALS — BP 133/78 | HR 61 | Temp 97.3°F | Ht 61.0 in | Wt 217.6 lb

## 2016-12-12 DIAGNOSIS — E118 Type 2 diabetes mellitus with unspecified complications: Secondary | ICD-10-CM

## 2016-12-12 DIAGNOSIS — R011 Cardiac murmur, unspecified: Secondary | ICD-10-CM | POA: Diagnosis not present

## 2016-12-12 DIAGNOSIS — I10 Essential (primary) hypertension: Secondary | ICD-10-CM | POA: Diagnosis not present

## 2016-12-12 DIAGNOSIS — Z1159 Encounter for screening for other viral diseases: Secondary | ICD-10-CM

## 2016-12-12 LAB — BAYER DCA HB A1C WAIVED: HB A1C: 8 % — AB (ref <7.0)

## 2016-12-12 MED ORDER — METFORMIN HCL 1000 MG PO TABS: 1000.0000 mg | ORAL_TABLET | Freq: Two times a day (BID) | ORAL | 2 refills | Status: DC

## 2016-12-12 MED ORDER — DULAGLUTIDE 1.5 MG/0.5ML ~~LOC~~ SOAJ: 1.5000 mg | SUBCUTANEOUS | 2 refills | Status: DC

## 2016-12-12 NOTE — Progress Notes (Signed)
BP 133/78   Pulse 61   Temp 97.3 F (36.3 C) (Oral)   Ht '5\' 1"'  (1.549 m)   Wt 217 lb 9.6 oz (98.7 kg)   BMI 41.12 kg/m    Subjective:    Patient ID: Samantha Camacho, female    DOB: May 08, 1956, 61 y.o.   MRN: [id number]  HPI: Samantha Camacho is a 61 y.o. female presenting on 12/12/2016 for Diabetes (followup) and Hypertension   HPI Hypertension follow-up. Patient is coming in for hypertension follow-up today. Her blood pressure today is 133/78. She is currently on Norvasc 10 mg daily. Patient denies headaches, blurred vision, chest pains, shortness of breath, or weakness. Denies any side effects from medication and is content with current medication.   Type 2 diabetes mellitus Patient comes in today for recheck of his diabetes. He has been currently taking metformin and Levemir 35 units daily. She had an issue where she was in the hospital recently because she excellently gave herself 35 units of NovoLog and was monitored during this time. She did not become hypoglycemic and was discharged with recommendations to discontinue the NovoLog. She says that her blood sugars in the a.m. are running between 9820. She is not currently on an ACE inhibitor.  Shehas not seen an ophthalmologist this year.  She denies any issues with his feet.   Systolic murmur Patient did not know she had a systolic murmur  Relevant past medical, surgical, family and social history reviewed and updated as indicated. Interim medical history since our last visit reviewed. Allergies and medications reviewed and updated.  Review of Systems  Constitutional: Negative for chills and fever.  HENT: Negative for congestion, ear discharge and ear pain.   Eyes: Negative for redness and visual disturbance.  Respiratory: Negative for cough, chest tightness and shortness of breath.   Cardiovascular: Negative for chest pain and leg swelling.  Genitourinary: Negative for difficulty urinating and dysuria.  Musculoskeletal:  Negative for back pain and gait problem.  Skin: Negative for rash.  Neurological: Negative for dizziness, light-headedness, numbness and headaches.  Psychiatric/Behavioral: Negative for agitation and behavioral problems.  All other systems reviewed and are negative.   Per HPI unless specifically indicated above      Objective:    BP 133/78   Pulse 61   Temp 97.3 F (36.3 C) (Oral)   Ht '5\' 1"'  (1.549 m)   Wt 217 lb 9.6 oz (98.7 kg)   BMI 41.12 kg/m   Wt Readings from Last 3 Encounters:  12/12/16 217 lb 9.6 oz (98.7 kg)  11/28/16 218 lb (98.9 kg)  11/13/16 218 lb (98.9 kg)    Physical Exam  Constitutional: She is oriented to person, place, and time. She appears well-developed and well-nourished. No distress.  HENT:  Right Ear: External ear normal.  Left Ear: External ear normal.  Nose: Nose normal.  Mouth/Throat: Oropharynx is clear and moist.  Eyes: Conjunctivae are normal.  Neck: Neck supple.  Cardiovascular: Normal rate, regular rhythm and intact distal pulses.   Murmur (Systolic murmur grade 2/6) heard. Pulmonary/Chest: Effort normal and breath sounds normal. No respiratory distress. She has no wheezes. She has no rales.  Musculoskeletal: Normal range of motion. She exhibits no edema or tenderness.  Lymphadenopathy:    She has no cervical adenopathy.  Neurological: She is alert and oriented to person, place, and time. Coordination normal.  Skin: Skin is warm and dry. No rash noted. She is not diaphoretic.  Psychiatric: She has a normal mood and  affect. Her behavior is normal.  Nursing note and vitals reviewed.     Assessment & Plan:   Problem List Items Addressed This Visit      Cardiovascular and Mediastinum   Essential hypertension - Primary   Relevant Orders   CMP14+EGFR (Completed)   CBC with Differential/Platelet (Completed)   Lipid panel (Completed)   Microalbumin / creatinine urine ratio (Completed)     Endocrine   Type 2 diabetes mellitus (HCC)    Relevant Medications   metFORMIN (GLUCOPHAGE) 1000 MG tablet   Dulaglutide (TRULICITY) 1.5 RY/7.8GB SOPN   Other Relevant Orders   CMP14+EGFR (Completed)   CBC with Differential/Platelet (Completed)   Lipid panel (Completed)   Bayer DCA Hb A1c Waived (Completed)   Microalbumin / creatinine urine ratio (Completed)    Other Visit Diagnoses    Systolic murmur       Relevant Orders   ECHOCARDIOGRAM COMPLETE   Need for hepatitis C screening test       Relevant Orders   Hepatitis C antibody (Completed)       Follow up plan: Return in about 3 months (around 03/14/2017), or if symptoms worsen or fail to improve, for Make an appointment to come back to see Tammy in one or 2 months, come back and see me in 3 months.  Counseling provided for all of the vaccine components Orders Placed This Encounter  Procedures  . CMP14+EGFR  . CBC with Differential/Platelet  . Lipid panel  . Bayer DCA Hb A1c Waived  . Hepatitis C antibody  . Microalbumin / creatinine urine ratio  . ECHOCARDIOGRAM COMPLETE    Caryl Pina, MD [facility] 12/12/2016, 11:06 AM

## 2016-12-13 ENCOUNTER — Telehealth: Payer: Self-pay

## 2016-12-13 LAB — CBC WITH DIFFERENTIAL/PLATELET
BASOS ABS: 0 10*3/uL (ref 0.0–0.2)
BASOS: 1 %
EOS (ABSOLUTE): 0.4 10*3/uL (ref 0.0–0.4)
Eos: 5 %
HEMOGLOBIN: 12.7 g/dL (ref 11.1–15.9)
Hematocrit: 37.9 % (ref 34.0–46.6)
IMMATURE GRANS (ABS): 0 10*3/uL (ref 0.0–0.1)
IMMATURE GRANULOCYTES: 0 %
LYMPHS: 34 %
Lymphocytes Absolute: 2.8 10*3/uL (ref 0.7–3.1)
MCH: 30.1 pg (ref 26.6–33.0)
MCHC: 33.5 g/dL (ref 31.5–35.7)
MCV: 90 fL (ref 79–97)
MONOCYTES: 6 %
Monocytes Absolute: 0.5 10*3/uL (ref 0.1–0.9)
NEUTROS PCT: 54 %
Neutrophils Absolute: 4.5 10*3/uL (ref 1.4–7.0)
Platelets: 218 10*3/uL (ref 150–379)
RBC: 4.22 x10E6/uL (ref 3.77–5.28)
RDW: 14.9 % (ref 12.3–15.4)
WBC: 8.2 10*3/uL (ref 3.4–10.8)

## 2016-12-13 LAB — LIPID PANEL
Chol/HDL Ratio: 2.9 ratio units (ref 0.0–4.4)
Cholesterol, Total: 178 mg/dL (ref 100–199)
HDL: 61 mg/dL (ref >39)
LDL CALC: 100 mg/dL — AB (ref 0–99)
Triglycerides: 85 mg/dL (ref 0–149)
VLDL CHOLESTEROL CAL: 17 mg/dL (ref 5–40)

## 2016-12-13 LAB — CMP14+EGFR
ALT: 21 IU/L (ref 0–32)
AST: 16 IU/L (ref 0–40)
Albumin/Globulin Ratio: 1.6 (ref 1.2–2.2)
Albumin: 4.7 g/dL (ref 3.6–4.8)
Alkaline Phosphatase: 69 IU/L (ref 39–117)
BUN/Creatinine Ratio: 15 (ref 12–28)
BUN: 9 mg/dL (ref 8–27)
Bilirubin Total: 0.4 mg/dL (ref 0.0–1.2)
CALCIUM: 9.3 mg/dL (ref 8.7–10.3)
CO2: 23 mmol/L (ref 18–29)
Chloride: 103 mmol/L (ref 96–106)
Creatinine, Ser: 0.61 mg/dL (ref 0.57–1.00)
GFR, EST AFRICAN AMERICAN: 113 mL/min/{1.73_m2} (ref >59)
GFR, EST NON AFRICAN AMERICAN: 98 mL/min/{1.73_m2} (ref >59)
GLUCOSE: 85 mg/dL (ref 65–99)
Globulin, Total: 2.9 g/dL (ref 1.5–4.5)
Potassium: 3.7 mmol/L (ref 3.5–5.2)
Sodium: 143 mmol/L (ref 134–144)
TOTAL PROTEIN: 7.6 g/dL (ref 6.0–8.5)

## 2016-12-13 LAB — HEPATITIS C ANTIBODY: Hep C Virus Ab: 0.1 s/co ratio (ref 0.0–0.9)

## 2016-12-13 LAB — MICROALBUMIN / CREATININE URINE RATIO
Creatinine, Urine: 33.4 mg/dL
MICROALB/CREAT RATIO: 381.1 mg/g{creat} — AB (ref 0.0–30.0)
Microalbumin, Urine: 127.3 ug/mL

## 2016-12-13 MED ORDER — LISINOPRIL 5 MG PO TABS: 5.0000 mg | ORAL_TABLET | Freq: Every day | ORAL | 6 refills | Status: DC

## 2016-12-13 NOTE — Telephone Encounter (Signed)
Patient was informed of lab results, prescription for Lisinopril 5 mg was sent to [facility]

## 2017-01-10 ENCOUNTER — Telehealth: Payer: Self-pay | Admitting: Family Medicine

## 2017-01-10 NOTE — Telephone Encounter (Signed)
Pt notified of otc equivalent Verbalizes understanding

## 2017-01-15 ENCOUNTER — Ambulatory Visit (HOSPITAL_COMMUNITY)
Admission: RE | Admit: 2017-01-15 | Discharge: 2017-01-15 | Disposition: A | Discharge status: Home / Self Care | Payer: 59 | Source: Ambulatory Visit | Attending: Family Medicine | Admitting: Family Medicine

## 2017-01-15 DIAGNOSIS — R011 Cardiac murmur, unspecified: Secondary | ICD-10-CM | POA: Diagnosis not present

## 2017-01-15 DIAGNOSIS — I081 Rheumatic disorders of both mitral and tricuspid valves: Secondary | ICD-10-CM | POA: Diagnosis not present

## 2017-01-15 LAB — ECHOCARDIOGRAM COMPLETE
AOPV: 0.76 m/s
AV Mean grad: 7 mmHg
AV Peak grad: 14 mmHg
AV VEL mean LVOT/AV: 0.71
AV peak Index: 0.92
AV pk vel: 189 cm/s
AV vel: 1.76
AVAREAMEANV: 1.82 cm2
AVAREAMEANVIN: 0.86 cm2/m2
AVAREAVTI: 1.94 cm2
AVAREAVTIIND: 0.83 cm2/m2
CHL CUP AV VALUE AREA INDEX: 0.83
CHL CUP DOP CALC LVOT VTI: 30.5 cm
CHL CUP RV SYS PRESS: 29 mmHg
CHL CUP STROKE VOLUME: 62 mL
CHL CUP TV REG PEAK VELOCITY: 257 cm/s
DOP CAL AO MEAN VELOCITY: 128 cm/s
E/e' ratio: 13.1
EWDT: 197 ms
FS: 36 % (ref 28–44)
IVS/LV PW RATIO, ED: 0.85
LA ID, A-P, ES: 43 mm
LA diam end sys: 43 mm
LA vol A4C: 63.2 ml
LA vol: 64.3 mL
LADIAMINDEX: 2.03 cm/m2
LAVOLIN: 30.4 mL/m2
LDCA: 2.54 cm2
LV E/e' medial: 13.1
LV E/e'average: 13.1
LV PW d: 9.95 mm — AB (ref 0.6–1.1)
LV e' LATERAL: 8.7 cm/s
LV sys vol: 40 mL
LVDIAVOL: 102 mL (ref 46–106)
LVDIAVOLIN: 48 mL/m2
LVOT SV: 77 mL
LVOT diameter: 18 mm
LVOT peak grad rest: 8 mmHg
LVOT peak vel: 144 cm/s
LVOTVTI: 0.69 cm
LVSYSVOLIN: 19 mL/m2
Lateral S' vel: 19.3 cm/s
MV Dec: 197
MV Peak grad: 5 mmHg
MVPKAVEL: 99.9 m/s
MVPKEVEL: 114 m/s
RV TAPSE: 33.9 mm
Simpson's disk: 61
TDI e' lateral: 8.7
TDI e' medial: 7.83
TRMAXVEL: 257 cm/s
VTI: 43.9 cm
Valve area: 1.76 cm2

## 2017-01-15 NOTE — Progress Notes (Signed)
*  PRELIMINARY RESULTS* Echocardiogram 2D Echocardiogram has been performed.  Samantha Camacho 01/15/2017, 12:19 PM

## 2017-02-15 ENCOUNTER — Encounter: Payer: Self-pay | Admitting: Family Medicine

## 2017-02-15 ENCOUNTER — Ambulatory Visit (INDEPENDENT_AMBULATORY_CARE_PROVIDER_SITE_OTHER): Payer: 59 | Admitting: Family Medicine

## 2017-02-15 ENCOUNTER — Ambulatory Visit (INDEPENDENT_AMBULATORY_CARE_PROVIDER_SITE_OTHER): Payer: 59

## 2017-02-15 VITALS — BP 127/74 | HR 93 | Temp 98.1°F | Ht 61.0 in | Wt 205.0 lb

## 2017-02-15 DIAGNOSIS — R053 Chronic cough: Secondary | ICD-10-CM

## 2017-02-15 DIAGNOSIS — R05 Cough: Secondary | ICD-10-CM

## 2017-02-15 MED ORDER — HYDROCODONE-HOMATROPINE 5-1.5 MG/5ML PO SYRP: 5.0000 mL | ORAL_SOLUTION | Freq: Four times a day (QID) | ORAL | 0 refills | Status: DC | PRN

## 2017-02-15 MED ORDER — FLUTICASONE PROPIONATE 50 MCG/ACT NA SUSP: 1.0000 | Freq: Two times a day (BID) | NASAL | 6 refills | Status: DC | PRN

## 2017-02-15 NOTE — Progress Notes (Signed)
BP 127/74 (BP Location: Right Arm)   Pulse 93   Temp 98.1 F (36.7 C) (Oral)   Ht 5\' 1"  (1.549 m)   Wt 205 lb (93 kg)   BMI 38.73 kg/m    Subjective:    Patient ID: Samantha Camacho, female    DOB: Feb 10, 1956, 61 y.o.   MRN: [id number]  HPI: Samantha Camacho is a 61 y.o. female presenting on 02/15/2017 for Cough (congestion - started about 2 mos ago / no fever)   HPI Cough and congestion Patient has been having a persistent cough and congestion that started 2 months ago. The cough has been tried to treat with a couple rounds of antibiotics but has not improved. She denies any fevers or chills or shortness of breath or wheezing. Her cough has been nonproductive and it is keeping her up at night sometimes.  Relevant past medical, surgical, family and social history reviewed and updated as indicated. Interim medical history since our last visit reviewed. Allergies and medications reviewed and updated.  Review of Systems  Constitutional: Negative for chills and fever.  HENT: Positive for congestion, postnasal drip, rhinorrhea, sinus pressure, sneezing and sore throat. Negative for ear discharge and ear pain.   Eyes: Negative for pain, redness and visual disturbance.  Respiratory: Positive for cough. Negative for chest tightness, shortness of breath and wheezing.   Cardiovascular: Negative for chest pain and leg swelling.  Genitourinary: Negative for difficulty urinating and dysuria.  Musculoskeletal: Negative for back pain and gait problem.  Skin: Negative for rash.  Neurological: Negative for light-headedness and headaches.  Psychiatric/Behavioral: Negative for agitation and behavioral problems.  All other systems reviewed and are negative.   Per HPI unless specifically indicated above     Objective:    BP 127/74 (BP Location: Right Arm)   Pulse 93   Temp 98.1 F (36.7 C) (Oral)   Ht 5\' 1"  (1.549 m)   Wt 205 lb (93 kg)   BMI 38.73 kg/m   Wt Readings from Last 3  Encounters:  02/15/17 205 lb (93 kg)  12/12/16 217 lb 9.6 oz (98.7 kg)  11/28/16 218 lb (98.9 kg)    Physical Exam  Constitutional: She is oriented to person, place, and time. She appears well-developed and well-nourished. No distress.  HENT:  Right Ear: Tympanic membrane, external ear and ear canal normal.  Left Ear: Tympanic membrane, external ear and ear canal normal.  Nose: Mucosal edema and rhinorrhea present. No epistaxis. Right sinus exhibits no maxillary sinus tenderness and no frontal sinus tenderness. Left sinus exhibits no maxillary sinus tenderness and no frontal sinus tenderness.  Mouth/Throat: Uvula is midline and mucous membranes are normal. Posterior oropharyngeal edema and posterior oropharyngeal erythema present. No oropharyngeal exudate or tonsillar abscesses.  Eyes: Conjunctivae are normal.  Neck: Neck supple. No thyromegaly present.  Cardiovascular: Normal rate, regular rhythm, normal heart sounds and intact distal pulses.   No murmur heard. Pulmonary/Chest: Effort normal and breath sounds normal. No respiratory distress. She has no wheezes. She has no rales.  Musculoskeletal: Normal range of motion. She exhibits no edema or tenderness.  Lymphadenopathy:    She has no cervical adenopathy.  Neurological: She is alert and oriented to person, place, and time. Coordination normal.  Skin: Skin is warm and dry. No rash noted. She is not diaphoretic.  Psychiatric: She has a normal mood and affect. Her behavior is normal.  Vitals reviewed.  Chest x-ray: await final read from radiology.    Assessment & Plan:  Problem List Items Addressed This Visit    None    Visit Diagnoses    Persistent cough    -  Primary   Recommended allergies: Flonase, will also do chest x-ray   Relevant Medications   HYDROcodone-homatropine (HYCODAN) 5-1.5 MG/5ML syrup   fluticasone (FLONASE) 50 MCG/ACT nasal spray   Other Relevant Orders   DG Chest 2 View (Completed)       Follow up  plan: Return if symptoms worsen or fail to improve.  Counseling provided for all of the vaccine components Orders Placed This Encounter  Procedures  . DG Chest 2 View    Caryl Pina, MD [facility] 02/15/2017, 4:19 PM

## 2017-03-01 ENCOUNTER — Telehealth: Payer: Self-pay | Admitting: Family Medicine

## 2017-03-06 NOTE — Telephone Encounter (Signed)
Scheduled

## 2017-03-14 ENCOUNTER — Ambulatory Visit: Payer: 59 | Admitting: Family Medicine

## 2017-03-26 ENCOUNTER — Encounter: Payer: Self-pay | Admitting: Family Medicine

## 2017-03-26 ENCOUNTER — Ambulatory Visit (INDEPENDENT_AMBULATORY_CARE_PROVIDER_SITE_OTHER): Payer: 59 | Admitting: Family Medicine

## 2017-03-26 VITALS — BP 138/75 | HR 64 | Temp 98.0°F | Ht 61.0 in | Wt 201.0 lb

## 2017-03-26 DIAGNOSIS — E118 Type 2 diabetes mellitus with unspecified complications: Secondary | ICD-10-CM

## 2017-03-26 DIAGNOSIS — I10 Essential (primary) hypertension: Secondary | ICD-10-CM

## 2017-03-26 DIAGNOSIS — E1110 Type 2 diabetes mellitus with ketoacidosis without coma: Secondary | ICD-10-CM

## 2017-03-26 DIAGNOSIS — K219 Gastro-esophageal reflux disease without esophagitis: Secondary | ICD-10-CM | POA: Insufficient documentation

## 2017-03-26 LAB — BAYER DCA HB A1C WAIVED: HB A1C (BAYER DCA - WAIVED): 5.3 % (ref <7.0)

## 2017-03-26 MED ORDER — RANITIDINE HCL 150 MG PO TABS: 150.0000 mg | ORAL_TABLET | Freq: Two times a day (BID) | ORAL | 6 refills | Status: DC

## 2017-03-26 MED ORDER — METFORMIN HCL 1000 MG PO TABS: 1000.0000 mg | ORAL_TABLET | Freq: Two times a day (BID) | ORAL | 2 refills | Status: DC

## 2017-03-26 MED ORDER — LISINOPRIL 5 MG PO TABS: 5.0000 mg | ORAL_TABLET | Freq: Every day | ORAL | 6 refills | Status: DC

## 2017-03-26 MED ORDER — INSULIN DETEMIR 100 UNIT/ML FLEXPEN: 35.0000 [IU] | PEN_INJECTOR | Freq: Every day | SUBCUTANEOUS | 2 refills | Status: DC

## 2017-03-26 NOTE — Progress Notes (Addendum)
BP 138/75   Pulse 64   Temp 98 F (36.7 C) (Oral)   Ht 5\' 1"  (1.549 m)   Wt 201 lb (91.2 kg)   BMI 37.98 kg/m    Subjective:    Patient ID: Samantha Camacho, female    DOB: Mar 04, 1956, 61 y.o.   MRN: [id number]  HPI: Samantha Camacho is a 61 y.o. female presenting on 03/26/2017 for Diabetes (3 mo followup; patient is not fasting) and Hypertension   HPI Type 2 diabetes mellitus Patient comes in today for recheck of his diabetes. Patient has been currently taking Levemir 35 metformin 1000 twice a day, patient had not started trulicity yet. Patient is currently on an ACE inhibitor. Patient has not seen an ophthalmologist this year. Patient denies any issues with his feet.   Hypertension Patient is currently on lisinopril, and her blood pressure today is 138/75. Patient denies any lightheadedness or dizziness. Patient denies headaches, blurred vision, chest pains, shortness of breath, or weakness. Denies any side effects from medication and is content with current medication.   GERD Patient is coming in for GERD recheck as well and refill on her Zantac. She is thinking about just doing Zantac when necessary because she has not been happening is she she denies any abdominal pain or reflux or heartburn. She denies any blood in her stool.  Relevant past medical, surgical, family and social history reviewed and updated as indicated. Interim medical history since our last visit reviewed. Allergies and medications reviewed and updated.  Review of Systems  Constitutional: Negative for chills and fever.  HENT: Negative for congestion, ear discharge and ear pain.   Eyes: Negative for redness and visual disturbance.  Respiratory: Negative for chest tightness and shortness of breath.   Cardiovascular: Negative for chest pain and leg swelling.  Gastrointestinal: Negative for abdominal pain, blood in stool, nausea and vomiting.  Genitourinary: Negative for difficulty urinating and dysuria.    Musculoskeletal: Negative for back pain and gait problem.  Skin: Negative for rash.  Neurological: Negative for dizziness, light-headedness, numbness and headaches.  Psychiatric/Behavioral: Negative for agitation and behavioral problems.  All other systems reviewed and are negative.   Per HPI unless specifically indicated above        Objective:    BP 138/75   Pulse 64   Temp 98 F (36.7 C) (Oral)   Ht 5\' 1"  (1.549 m)   Wt 201 lb (91.2 kg)   BMI 37.98 kg/m   Wt Readings from Last 3 Encounters:  03/26/17 201 lb (91.2 kg)  02/15/17 205 lb (93 kg)  12/12/16 217 lb 9.6 oz (98.7 kg)    Physical Exam  Constitutional: She is oriented to person, place, and time. She appears well-developed and well-nourished. No distress.  Eyes: Conjunctivae are normal.  Cardiovascular: Normal rate, regular rhythm and intact distal pulses.   Murmur (Grade 3/6 systolic crescendo/decrescendo murmur, has had echocardiograms just this year) heard. Pulmonary/Chest: Effort normal and breath sounds normal. No respiratory distress. She has no wheezes.  Abdominal: Soft. Bowel sounds are normal. She exhibits no distension. There is no tenderness. There is no rebound.  Musculoskeletal: Normal range of motion. She exhibits no edema or tenderness.  Neurological: She is alert and oriented to person, place, and time. Coordination normal.  Skin: Skin is warm and dry. No rash noted. She is not diaphoretic.  Psychiatric: She has a normal mood and affect. Her behavior is normal.  Nursing note and vitals reviewed.  Assessment & Plan:   Problem List Items Addressed This Visit      Cardiovascular and Mediastinum   Essential hypertension - Primary   Relevant Medications   lisinopril (PRINIVIL,ZESTRIL) 5 MG tablet     Digestive   Gastroesophageal reflux disease without esophagitis   Relevant Medications   ranitidine (ZANTAC) 150 MG tablet     Endocrine   Type 2 diabetes mellitus (HCC)   Relevant  Medications   lisinopril (PRINIVIL,ZESTRIL) 5 MG tablet   Insulin Detemir (LEVEMIR) 100 UNIT/ML Pen   metFORMIN (GLUCOPHAGE) 1000 MG tablet   Other Relevant Orders   Bayer DCA Hb A1c Waived (Completed)   Bayer DCA Hb A1c Waived (Completed)       Follow up plan: Return in about 3 months (around 06/26/2017), or if symptoms worsen or fail to improve, for Diabetes recheck.  Counseling provided for all of the vaccine components Orders Placed This Encounter  Procedures  . [facility] Hb A1c [place], MD [facility] 03/26/2017, 9:47 AM

## 2017-04-09 ENCOUNTER — Other Ambulatory Visit: Payer: Self-pay | Admitting: Family Medicine

## 2017-04-09 DIAGNOSIS — R05 Cough: Secondary | ICD-10-CM

## 2017-04-09 DIAGNOSIS — R053 Chronic cough: Secondary | ICD-10-CM

## 2017-04-10 MED ORDER — HYDROCODONE-HOMATROPINE 5-1.5 MG/5ML PO SYRP: 5.0000 mL | ORAL_SOLUTION | Freq: Four times a day (QID) | ORAL | 0 refills | Status: DC | PRN

## 2017-04-10 NOTE — Telephone Encounter (Signed)
Aware. 

## 2017-04-10 NOTE — Telephone Encounter (Signed)
RX ready for pick up 

## 2017-04-10 NOTE — Telephone Encounter (Signed)
Dettingers, called x 2 today

## 2017-06-13 ENCOUNTER — Other Ambulatory Visit: Payer: Self-pay | Admitting: Family Medicine

## 2017-06-13 MED ORDER — AMLODIPINE BESYLATE 10 MG PO TABS: 10.0000 mg | ORAL_TABLET | Freq: Every day | ORAL | 1 refills | Status: DC

## 2017-06-13 NOTE — Telephone Encounter (Signed)
Please review and advise.

## 2017-06-13 NOTE — Telephone Encounter (Signed)
What is the name of the medication? amLODipine (NORVASC) 10 MG tablet  Have you contacted your pharmacy to request a refill? No prescribed by another dr and patient walked in to ask for a refill  Which pharmacy would you like this sent to? Use [facility] [place].   Patient notified that their request is being sent to the clinical staff for review and that they should receive a call once it is complete. If they do not receive a call within 24 hours they can check with their pharmacy or our office.

## 2017-06-13 NOTE — Telephone Encounter (Signed)
Go ahead and send refill for amlodipine 10 mg daily and give her 6 months worth

## 2017-06-13 NOTE — Telephone Encounter (Signed)
Pt notified RX was sent to pharmacy [facility] per Dr Dettinger

## 2017-06-27 ENCOUNTER — Ambulatory Visit: Payer: 59 | Admitting: Family Medicine

## 2017-07-02 DIAGNOSIS — Z1231 Encounter for screening mammogram for malignant neoplasm of breast: Secondary | ICD-10-CM | POA: Diagnosis not present

## 2017-07-09 ENCOUNTER — Other Ambulatory Visit: Payer: Self-pay | Admitting: Family Medicine

## 2017-07-09 DIAGNOSIS — E118 Type 2 diabetes mellitus with unspecified complications: Secondary | ICD-10-CM

## 2017-07-10 NOTE — Telephone Encounter (Signed)
Next OV 08/15/17

## 2017-08-15 ENCOUNTER — Ambulatory Visit: Payer: 59 | Admitting: Family Medicine

## 2017-08-15 ENCOUNTER — Encounter: Payer: Self-pay | Admitting: Family Medicine

## 2017-08-15 VITALS — BP 139/78 | HR 64 | Temp 97.5°F | Ht 61.0 in | Wt 202.0 lb

## 2017-08-15 DIAGNOSIS — Z23 Encounter for immunization: Secondary | ICD-10-CM | POA: Diagnosis not present

## 2017-08-15 DIAGNOSIS — R809 Proteinuria, unspecified: Secondary | ICD-10-CM | POA: Diagnosis not present

## 2017-08-15 DIAGNOSIS — E1159 Type 2 diabetes mellitus with other circulatory complications: Secondary | ICD-10-CM | POA: Diagnosis not present

## 2017-08-15 DIAGNOSIS — K219 Gastro-esophageal reflux disease without esophagitis: Secondary | ICD-10-CM

## 2017-08-15 DIAGNOSIS — Z794 Long term (current) use of insulin: Secondary | ICD-10-CM | POA: Diagnosis not present

## 2017-08-15 DIAGNOSIS — I10 Essential (primary) hypertension: Secondary | ICD-10-CM | POA: Diagnosis not present

## 2017-08-15 DIAGNOSIS — E1129 Type 2 diabetes mellitus with other diabetic kidney complication: Secondary | ICD-10-CM | POA: Diagnosis not present

## 2017-08-15 LAB — CMP14+EGFR
ALBUMIN: 4.6 g/dL (ref 3.6–4.8)
ALK PHOS: 58 IU/L (ref 39–117)
ALT: 16 IU/L (ref 0–32)
AST: 12 IU/L (ref 0–40)
Albumin/Globulin Ratio: 1.7 (ref 1.2–2.2)
BUN / CREAT RATIO: 14 (ref 12–28)
BUN: 9 mg/dL (ref 8–27)
Bilirubin Total: 0.3 mg/dL (ref 0.0–1.2)
CO2: 25 mmol/L (ref 20–29)
CREATININE: 0.63 mg/dL (ref 0.57–1.00)
Calcium: 9.3 mg/dL (ref 8.7–10.3)
Chloride: 104 mmol/L (ref 96–106)
GFR calc non Af Amer: 96 mL/min/{1.73_m2} (ref >59)
GFR, EST AFRICAN AMERICAN: 111 mL/min/{1.73_m2} (ref >59)
Globulin, Total: 2.7 g/dL (ref 1.5–4.5)
Glucose: 88 mg/dL (ref 65–99)
Potassium: 3.8 mmol/L (ref 3.5–5.2)
SODIUM: 143 mmol/L (ref 134–144)
Total Protein: 7.3 g/dL (ref 6.0–8.5)

## 2017-08-15 LAB — LIPID PANEL
CHOL/HDL RATIO: 2.6 ratio (ref 0.0–4.4)
CHOLESTEROL TOTAL: 165 mg/dL (ref 100–199)
HDL: 63 mg/dL (ref >39)
LDL CALC: 92 mg/dL (ref 0–99)
Triglycerides: 48 mg/dL (ref 0–149)
VLDL Cholesterol Cal: 10 mg/dL (ref 5–40)

## 2017-08-15 LAB — BAYER DCA HB A1C WAIVED: HB A1C (BAYER DCA - WAIVED): 5.6 % (ref <7.0)

## 2017-08-15 MED ORDER — LOSARTAN POTASSIUM 25 MG PO TABS: 25.0000 mg | ORAL_TABLET | Freq: Every day | ORAL | 5 refills | Status: DC

## 2017-08-15 MED ORDER — INSULIN DETEMIR 100 UNIT/ML ~~LOC~~ SOLN: 25.0000 [IU] | Freq: Every day | SUBCUTANEOUS | Status: DC

## 2017-08-15 MED ORDER — METFORMIN HCL 1000 MG PO TABS: 1000.0000 mg | ORAL_TABLET | Freq: Two times a day (BID) | ORAL | 0 refills | Status: DC

## 2017-08-15 MED ORDER — INSULIN DETEMIR 100 UNIT/ML FLEXPEN: 35.0000 [IU] | PEN_INJECTOR | Freq: Every day | SUBCUTANEOUS | 2 refills | Status: DC

## 2017-08-15 MED ORDER — DULAGLUTIDE 0.75 MG/0.5ML ~~LOC~~ SOAJ: 0.7500 mg | SUBCUTANEOUS | 2 refills | Status: DC

## 2017-08-15 NOTE — Progress Notes (Signed)
BP 139/78   Pulse 64   Temp (!) 97.5 F (36.4 C) (Oral)   Ht '5\' 1"'  (1.549 m)   Wt 202 lb (91.6 kg)   BMI 38.17 kg/m    Subjective:    Patient ID: Samantha Camacho, female    DOB: 1956/04/11, 61 y.o.   MRN: [id number]  HPI: Samantha Camacho is a 61 y.o. female presenting on 08/15/2017 for Hypertension (3 mo follow up, patient is fasting) and Diabetes   HPI Type 2 diabetes mellitus Patient comes in today for recheck of his diabetes. Patient has been currently taking Trulicity and Levemir 35 and metformin. Patient is not currently on an ACE inhibitor/ARB because she had a cough from lisinopril, we will start losartan. Patient has not seen an ophthalmologist this year. Patient denies any issues with their feet.  Patient has known microalbuminuria and we will start losartan for her  Hypertension Patient is currently on amlodipine, and their blood pressure today is 139/78. Patient denies any lightheadedness or dizziness. Patient denies headaches, blurred vision, chest pains, shortness of breath, or weakness. Denies any side effects from medication and is content with current medication.   GERD Patient is currently on ranitidine.  She denies any major symptoms or abdominal pain or belching or burping. She denies any blood in her stool or lightheadedness or dizziness.   Relevant past medical, surgical, family and social history reviewed and updated as indicated. Interim medical history since our last visit reviewed. Allergies and medications reviewed and updated.  Review of Systems  Constitutional: Negative for chills and fever.  Eyes: Negative for visual disturbance.  Respiratory: Negative for chest tightness and shortness of breath.   Cardiovascular: Negative for chest pain and leg swelling.  Musculoskeletal: Negative for back pain and gait problem.  Skin: Negative for color change and rash.  Neurological: Negative for dizziness, light-headedness and headaches.  Psychiatric/Behavioral:  Negative for agitation and behavioral problems.  All other systems reviewed and are negative.   Per HPI unless specifically indicated above     Objective:    BP 139/78   Pulse 64   Temp (!) 97.5 F (36.4 C) (Oral)   Ht '5\' 1"'  (1.549 m)   Wt 202 lb (91.6 kg)   BMI 38.17 kg/m   Wt Readings from Last 3 Encounters:  08/15/17 202 lb (91.6 kg)  03/26/17 201 lb (91.2 kg)  02/15/17 205 lb (93 kg)    Physical Exam  Constitutional: She is oriented to person, place, and time. She appears well-developed and well-nourished. No distress.  Eyes: Conjunctivae are normal.  Cardiovascular: Normal rate, regular rhythm, normal heart sounds and intact distal pulses.  No murmur heard. Pulmonary/Chest: Effort normal and breath sounds normal. No respiratory distress. She has no wheezes. She has no rales.  Musculoskeletal: Normal range of motion. She exhibits no edema or tenderness.  Neurological: She is alert and oriented to person, place, and time. Coordination normal.  Skin: Skin is warm and dry. No rash noted. She is not diaphoretic.  Psychiatric: She has a normal mood and affect. Her behavior is normal.  Nursing note and vitals reviewed.   Results for orders placed or performed in visit on 08/15/17  Bayer DCA Hb A1c Waived  Result Value Ref Range   Bayer DCA Hb A1c Waived 5.6 <7.0 %  CMP14+EGFR  Result Value Ref Range   Glucose 88 65 - 99 mg/dL   BUN 9 8 - 27 mg/dL   Creatinine, Ser 0.63 0.57 - 1.00 mg/dL  GFR calc non Af Amer 96 >59 mL/min/1.73   GFR calc Af Amer 111 >59 mL/min/1.73   BUN/Creatinine Ratio 14 12 - 28   Sodium 143 134 - 144 mmol/L   Potassium 3.8 3.5 - 5.2 mmol/L   Chloride 104 96 - 106 mmol/L   CO2 25 20 - 29 mmol/L   Calcium 9.3 8.7 - 10.3 mg/dL   Total Protein 7.3 6.0 - 8.5 g/dL   Albumin 4.6 3.6 - 4.8 g/dL   Globulin, Total 2.7 1.5 - 4.5 g/dL   Albumin/Globulin Ratio 1.7 1.2 - 2.2   Bilirubin Total 0.3 0.0 - 1.2 mg/dL   Alkaline Phosphatase 58 39 - 117 IU/L    AST 12 0 - 40 IU/L   ALT 16 0 - 32 IU/L  Lipid panel  Result Value Ref Range   Cholesterol, Total 165 100 - 199 mg/dL   Triglycerides 48 0 - 149 mg/dL   HDL 63 >39 mg/dL   VLDL Cholesterol Cal 10 5 - 40 mg/dL   LDL Calculated 92 0 - 99 mg/dL   Chol/HDL Ratio 2.6 0.0 - 4.4 ratio      Assessment & Plan:   Problem List Items Addressed This Visit      Cardiovascular and Mediastinum   Hypertension associated with diabetes (HCC)   Relevant Medications   Dulaglutide (TRULICITY) 2.11 ZN/3.5AP SOPN   metFORMIN (GLUCOPHAGE) 1000 MG tablet   losartan (COZAAR) 25 MG tablet   Insulin Detemir (LEVEMIR) 100 UNIT/ML Pen   Other Relevant Orders   Lipid panel (Completed)     Digestive   Gastroesophageal reflux disease without esophagitis     Endocrine   Type 2 diabetes mellitus (HCC) - Primary   Relevant Medications   Dulaglutide (TRULICITY) 0.14 DC/3.0DT SOPN   metFORMIN (GLUCOPHAGE) 1000 MG tablet   losartan (COZAAR) 25 MG tablet   Insulin Detemir (LEVEMIR) 100 UNIT/ML Pen   Other Relevant Orders   Bayer DCA Hb A1c Waived (Completed)   CMP14+EGFR (Completed)   Lipid panel (Completed)    Other Visit Diagnoses    Need for immunization against influenza       Relevant Orders   Flu Vaccine QUAD 36+ mos IM (Completed)       Follow up plan: Return in about 3 months (around 11/15/2017), or if symptoms worsen or fail to improve, for Diabetes and hypertension recheck.  Counseling provided for all of the vaccine components Orders Placed This Encounter  Procedures  . Flu Vaccine QUAD 36+ mos IM  . Bayer DCA Hb A1c Waived  . CMP14+EGFR  . Lipid panel    Caryl Pina, MD [facility] 08/17/2017, 8:01 AM

## 2017-11-08 ENCOUNTER — Other Ambulatory Visit: Payer: Self-pay | Admitting: Family Medicine

## 2017-11-08 DIAGNOSIS — E1110 Type 2 diabetes mellitus with ketoacidosis without coma: Secondary | ICD-10-CM

## 2017-11-19 ENCOUNTER — Encounter: Payer: Self-pay | Admitting: Family Medicine

## 2017-11-19 ENCOUNTER — Ambulatory Visit: Payer: 59 | Admitting: Family Medicine

## 2017-11-19 VITALS — BP 131/83 | HR 53 | Temp 98.1°F | Ht 61.0 in | Wt 203.0 lb

## 2017-11-19 DIAGNOSIS — K219 Gastro-esophageal reflux disease without esophagitis: Secondary | ICD-10-CM | POA: Diagnosis not present

## 2017-11-19 DIAGNOSIS — R809 Proteinuria, unspecified: Secondary | ICD-10-CM | POA: Diagnosis not present

## 2017-11-19 DIAGNOSIS — E1129 Type 2 diabetes mellitus with other diabetic kidney complication: Secondary | ICD-10-CM | POA: Diagnosis not present

## 2017-11-19 DIAGNOSIS — Z794 Long term (current) use of insulin: Secondary | ICD-10-CM | POA: Diagnosis not present

## 2017-11-19 DIAGNOSIS — I10 Essential (primary) hypertension: Secondary | ICD-10-CM | POA: Diagnosis not present

## 2017-11-19 DIAGNOSIS — E1159 Type 2 diabetes mellitus with other circulatory complications: Secondary | ICD-10-CM | POA: Diagnosis not present

## 2017-11-19 LAB — BAYER DCA HB A1C WAIVED: HB A1C: 5.2 % (ref <7.0)

## 2017-11-19 MED ORDER — METFORMIN HCL 1000 MG PO TABS: 1000.0000 mg | ORAL_TABLET | Freq: Two times a day (BID) | ORAL | 1 refills | Status: DC

## 2017-11-19 MED ORDER — AMLODIPINE BESYLATE 10 MG PO TABS: 10.0000 mg | ORAL_TABLET | Freq: Every day | ORAL | 1 refills | Status: DC

## 2017-11-19 MED ORDER — DULAGLUTIDE 0.75 MG/0.5ML ~~LOC~~ SOAJ: 0.7500 mg | SUBCUTANEOUS | 2 refills | Status: DC

## 2017-11-19 NOTE — Progress Notes (Signed)
BP 131/83   Pulse (!) 53   Temp 98.1 F (36.7 C) (Oral)   Ht 5' 1" (1.549 m)   Wt 203 lb (92.1 kg)   BMI 38.36 kg/m    Subjective:    Patient ID: Samantha Camacho, female    DOB: November 19, 1955, 62 y.o.   MRN: [id number]  HPI: Samantha Camacho is a 62 y.o. female presenting on 11/19/2017 for Diabetes (3 mo) and Hypertension   HPI Type 2 diabetes mellitus Patient comes in today for recheck of his diabetes. Patient has been currently taking metformin and Trulicity and 5 mg of Levemir, will have her go off of the Levemir.  She has been getting blood sugars between 88 and 10 7 in the morning and she has not really noticed any higher than that in the afternoons either. Patient is currently on an ACE inhibitor/ARB. Patient has not seen an ophthalmologist this year. Patient denies any issues with their feet.  Patient did have microalbuminuria last year but is currently on an arm.  Hypertension Patient is currently on losartan and amlodipine, and their blood pressure today is 131/83. Patient denies any lightheadedness or dizziness. Patient denies headaches, blurred vision, chest pains, shortness of breath, or weakness. Denies any side effects from medication and is content with current medication.   GERD Patient is currently on No medication and has been doing well.  She denies any major symptoms or abdominal pain or belching or burping. She denies any blood in her stool or lightheadedness or dizziness.  Relevant past medical, surgical, family and social history reviewed and updated as indicated. Interim medical history since our last visit reviewed. Allergies and medications reviewed and updated.  Review of Systems  Constitutional: Negative for chills and fever.  HENT: Negative for ear pain and tinnitus.   Eyes: Negative for pain.  Respiratory: Negative for cough, shortness of breath and wheezing.   Cardiovascular: Negative for chest pain, palpitations and leg swelling.  Gastrointestinal:  Negative for abdominal pain, blood in stool, constipation and diarrhea.  Genitourinary: Negative for dysuria and hematuria.  Musculoskeletal: Negative for back pain and myalgias.  Skin: Negative for rash.  Neurological: Negative for dizziness, weakness and headaches.  Psychiatric/Behavioral: Negative for suicidal ideas.    Per HPI unless specifically indicated above   Allergies as of 11/19/2017   No Known Allergies     Medication List        Accurate as of 11/19/17 10:22 AM. Always use your most recent med list.          amLODipine 10 MG tablet Commonly known as:  NORVASC Take 1 tablet (10 mg total) by mouth daily.   cholecalciferol 1000 units tablet Commonly known as:  VITAMIN D Take 1,000 Units by mouth daily.   Dulaglutide 0.75 MG/0.5ML Sopn Commonly known as:  TRULICITY Inject 7.79 mg once a week into the skin.   fluticasone 50 MCG/ACT nasal spray Commonly known as:  FLONASE Place 1 spray into both nostrils 2 (two) times daily as needed for allergies or rhinitis.   Insulin Detemir 100 UNIT/ML Pen Commonly known as:  LEVEMIR Inject 35 Units daily at 10 pm into the skin.   Insulin Pen Needle 31G X 5 MM Misc Commonly known as:  B-D UF III MINI PEN NEEDLES 1 each by Does not apply route 4 (four) times daily.   INSULIN SYRINGE .5CC/28G 28G X 1/2" 0.5 ML Misc 1 Units by Does not apply route 3 (three) times daily.   losartan 25  MG tablet Commonly known as:  COZAAR Take 1 tablet (25 mg total) daily by mouth.   metFORMIN 1000 MG tablet Commonly known as:  GLUCOPHAGE Take 1 tablet (1,000 mg total) 2 (two) times daily with a meal by mouth.   ONETOUCH VERIO test strip Generic drug:  glucose blood USE TO CHECK BLOOD SUGAR TWICE DAILY   Potassium 99 MG Tabs Take 1 tablet by mouth daily.   ranitidine 150 MG tablet Commonly known as:  ZANTAC Take 1 tablet (150 mg total) by mouth 2 (two) times daily.          Objective:    BP 131/83   Pulse (!) 53   Temp  98.1 F (36.7 C) (Oral)   Ht 5' 1" (1.549 m)   Wt 203 lb (92.1 kg)   BMI 38.36 kg/m   Wt Readings from Last 3 Encounters:  11/19/17 203 lb (92.1 kg)  08/15/17 202 lb (91.6 kg)  03/26/17 201 lb (91.2 kg)    Physical Exam  Constitutional: She is oriented to person, place, and time. She appears well-developed and well-nourished. No distress.  Eyes: Conjunctivae are normal.  Neck: Neck supple. No thyromegaly present.  Cardiovascular: Normal rate, regular rhythm, normal heart sounds and intact distal pulses.  No murmur heard. Pulmonary/Chest: Effort normal and breath sounds normal. No respiratory distress. She has no wheezes. She has no rales.  Musculoskeletal: Normal range of motion. She exhibits no edema.  Lymphadenopathy:    She has no cervical adenopathy.  Neurological: She is alert and oriented to person, place, and time. Coordination normal.  Skin: Skin is warm and dry. No rash noted. She is not diaphoretic.  Psychiatric: She has a normal mood and affect. Her behavior is normal.  Nursing note and vitals reviewed.   Results for orders placed or performed in visit on 08/15/17  Bayer DCA Hb A1c Waived  Result Value Ref Range   Bayer DCA Hb A1c Waived 5.6 <7.0 %  CMP14+EGFR  Result Value Ref Range   Glucose 88 65 - 99 mg/dL   BUN 9 8 - 27 mg/dL   Creatinine, Ser 0.63 0.57 - 1.00 mg/dL   GFR calc non Af Amer 96 >59 mL/min/1.73   GFR calc Af Amer 111 >59 mL/min/1.73   BUN/Creatinine Ratio 14 12 - 28   Sodium 143 134 - 144 mmol/L   Potassium 3.8 3.5 - 5.2 mmol/L   Chloride 104 96 - 106 mmol/L   CO2 25 20 - 29 mmol/L   Calcium 9.3 8.7 - 10.3 mg/dL   Total Protein 7.3 6.0 - 8.5 g/dL   Albumin 4.6 3.6 - 4.8 g/dL   Globulin, Total 2.7 1.5 - 4.5 g/dL   Albumin/Globulin Ratio 1.7 1.2 - 2.2   Bilirubin Total 0.3 0.0 - 1.2 mg/dL   Alkaline Phosphatase 58 39 - 117 IU/L   AST 12 0 - 40 IU/L   ALT 16 0 - 32 IU/L  Lipid panel  Result Value Ref Range   Cholesterol, Total 165 100 -  199 mg/dL   Triglycerides 48 0 - 149 mg/dL   HDL 63 >39 mg/dL   VLDL Cholesterol Cal 10 5 - 40 mg/dL   LDL Calculated 92 0 - 99 mg/dL   Chol/HDL Ratio 2.6 0.0 - 4.4 ratio      Assessment & Plan:   Problem List Items Addressed This Visit      Cardiovascular and Mediastinum   Hypertension associated with diabetes ([place])   Relevant Medications  Dulaglutide (TRULICITY) 5.73 UK/0.2RK SOPN   amLODipine (NORVASC) 10 MG tablet   metFORMIN (GLUCOPHAGE) 1000 MG tablet     Digestive   Gastroesophageal reflux disease without esophagitis     Endocrine   Type 2 diabetes mellitus (HCC) - Primary   Relevant Medications   Dulaglutide (TRULICITY) 2.70 WC/3.7SE SOPN   metFORMIN (GLUCOPHAGE) 1000 MG tablet   Other Relevant Orders   Bayer DCA Hb A1c Waived       Follow up plan: Return in about 3 months (around 02/16/2018), or if symptoms worsen or fail to improve, for Type 2 diabetes recheck.  Counseling provided for all of the vaccine components Orders Placed This Encounter  Procedures  . Bayer [facility] Hb A1c [place], MD [facility] 11/19/2017, 10:22 AM

## 2017-12-22 DIAGNOSIS — H5213 Myopia, bilateral: Secondary | ICD-10-CM | POA: Diagnosis not present

## 2018-02-20 ENCOUNTER — Ambulatory Visit: Payer: 59 | Admitting: Family Medicine

## 2018-03-01 ENCOUNTER — Other Ambulatory Visit: Payer: Self-pay | Admitting: Family Medicine

## 2018-03-01 DIAGNOSIS — E1110 Type 2 diabetes mellitus with ketoacidosis without coma: Secondary | ICD-10-CM

## 2018-06-20 ENCOUNTER — Other Ambulatory Visit: Payer: Self-pay | Admitting: Family Medicine

## 2018-06-21 NOTE — Telephone Encounter (Signed)
Ov 07/11/18

## 2018-07-11 ENCOUNTER — Encounter: Payer: Self-pay | Admitting: Family Medicine

## 2018-07-11 ENCOUNTER — Ambulatory Visit: Payer: 59 | Admitting: Family Medicine

## 2018-07-11 VITALS — BP 138/84 | HR 61 | Temp 97.8°F | Ht 61.0 in | Wt 197.8 lb

## 2018-07-11 DIAGNOSIS — Z23 Encounter for immunization: Secondary | ICD-10-CM

## 2018-07-11 DIAGNOSIS — E1159 Type 2 diabetes mellitus with other circulatory complications: Secondary | ICD-10-CM | POA: Diagnosis not present

## 2018-07-11 DIAGNOSIS — R809 Proteinuria, unspecified: Secondary | ICD-10-CM | POA: Diagnosis not present

## 2018-07-11 DIAGNOSIS — I10 Essential (primary) hypertension: Secondary | ICD-10-CM | POA: Diagnosis not present

## 2018-07-11 DIAGNOSIS — E1129 Type 2 diabetes mellitus with other diabetic kidney complication: Secondary | ICD-10-CM

## 2018-07-11 DIAGNOSIS — K219 Gastro-esophageal reflux disease without esophagitis: Secondary | ICD-10-CM | POA: Diagnosis not present

## 2018-07-11 LAB — LIPID PANEL
CHOLESTEROL TOTAL: 184 mg/dL (ref 100–199)
Chol/HDL Ratio: 2.8 ratio (ref 0.0–4.4)
HDL: 66 mg/dL (ref >39)
LDL CALC: 106 mg/dL — AB (ref 0–99)
TRIGLYCERIDES: 60 mg/dL (ref 0–149)
VLDL Cholesterol Cal: 12 mg/dL (ref 5–40)

## 2018-07-11 LAB — CBC WITH DIFFERENTIAL/PLATELET
BASOS ABS: 0 10*3/uL (ref 0.0–0.2)
Basos: 0 %
EOS (ABSOLUTE): 0.3 10*3/uL (ref 0.0–0.4)
Eos: 5 %
HEMATOCRIT: 35.9 % (ref 34.0–46.6)
Hemoglobin: 11.8 g/dL (ref 11.1–15.9)
Immature Grans (Abs): 0 10*3/uL (ref 0.0–0.1)
Immature Granulocytes: 0 %
LYMPHS ABS: 1.9 10*3/uL (ref 0.7–3.1)
Lymphs: 34 %
MCH: 30.1 pg (ref 26.6–33.0)
MCHC: 32.9 g/dL (ref 31.5–35.7)
MCV: 92 fL (ref 79–97)
MONOS ABS: 0.4 10*3/uL (ref 0.1–0.9)
Monocytes: 7 %
Neutrophils Absolute: 3 10*3/uL (ref 1.4–7.0)
Neutrophils: 54 %
Platelets: 202 10*3/uL (ref 150–450)
RBC: 3.92 x10E6/uL (ref 3.77–5.28)
RDW: 14.8 % (ref 12.3–15.4)
WBC: 5.6 10*3/uL (ref 3.4–10.8)

## 2018-07-11 LAB — CMP14+EGFR
ALT: 20 IU/L (ref 0–32)
AST: 14 IU/L (ref 0–40)
Albumin/Globulin Ratio: 1.8 (ref 1.2–2.2)
Albumin: 4.6 g/dL (ref 3.6–4.8)
Alkaline Phosphatase: 54 IU/L (ref 39–117)
BUN/Creatinine Ratio: 16 (ref 12–28)
BUN: 10 mg/dL (ref 8–27)
Bilirubin Total: 0.2 mg/dL (ref 0.0–1.2)
CO2: 22 mmol/L (ref 20–29)
CREATININE: 0.63 mg/dL (ref 0.57–1.00)
Calcium: 9.4 mg/dL (ref 8.7–10.3)
Chloride: 104 mmol/L (ref 96–106)
GFR calc Af Amer: 110 mL/min/{1.73_m2} (ref >59)
GFR calc non Af Amer: 96 mL/min/{1.73_m2} (ref >59)
GLOBULIN, TOTAL: 2.5 g/dL (ref 1.5–4.5)
GLUCOSE: 112 mg/dL — AB (ref 65–99)
Potassium: 3.7 mmol/L (ref 3.5–5.2)
SODIUM: 143 mmol/L (ref 134–144)
Total Protein: 7.1 g/dL (ref 6.0–8.5)

## 2018-07-11 LAB — BAYER DCA HB A1C WAIVED: HB A1C: 5.8 % (ref <7.0)

## 2018-07-11 MED ORDER — AMLODIPINE BESYLATE 10 MG PO TABS: 10.0000 mg | ORAL_TABLET | Freq: Every day | ORAL | 3 refills | Status: DC

## 2018-07-11 MED ORDER — METFORMIN HCL 1000 MG PO TABS: 1000.0000 mg | ORAL_TABLET | Freq: Two times a day (BID) | ORAL | 3 refills | Status: DC

## 2018-07-11 MED ORDER — LOSARTAN POTASSIUM 25 MG PO TABS: 25.0000 mg | ORAL_TABLET | Freq: Every day | ORAL | 3 refills | Status: DC

## 2018-07-11 NOTE — Progress Notes (Signed)
BP 138/84   Pulse 61   Temp 97.8 F (36.6 C) (Oral)   Ht '5\' 1"'  (1.549 m)   Wt 89.7 kg   BMI 37.37 kg/m    Subjective:    Patient ID: Samantha Camacho, female    DOB: [birth year], 62 y.o.   MRN: [id number]  HPI: Samantha Camacho is a 62 y.o. female presenting on 07/11/2018 for Diabetes (6 month follow up- swelling on right forearm x 1 month ) and Hypertension  Type 2 diabetes mellitus She comes in today for a recheck of her diabetes. She is taking Metformin and recently stopped taking Trulicity in April on her own due to dizziness and abdominal pain. Her last A1C on 11/19/17 was 5.2 and her blood sugars normally range in the 110-125s in the morning and 80-90s in the afternoon. She is currently on Losartan due to microalbuminuria. She saw an ophthalmologist in April and everything was good. Patient denies any foot issues related to neuropathy, but has a purple spot on the ball of her right foot that is not painful, but pruritic and worse at night. She has tried Lamisil and Cortisone for it with no relief.  Hypertension Patient is taking Losartan and Amlodipine for blood pressure control. Blood pressure today was 138/84. Patient denies any chest pain, dyspnea, headaches, blurred vision, weakness, lightheadedness, or dizziness. She has not had any side effects due to medications.  She has a one month history of right forearm swelling. She does not have any erythema or pain with the swelling.  The swelling is just distal to her right anterior elbow  GERD Patient is currently on no medication and had been on ranitidine but had stopped it and says that she is doing well.  She denies any major symptoms or abdominal pain or belching or burping. She denies any blood in her stool or lightheadedness or dizziness.   Relevant past medical, surgical, family and social history reviewed and updated as indicated. Interim medical history since our last visit reviewed. Allergies and medications reviewed and  updated.  Review of Systems  Constitutional: Negative for chills, fatigue and fever.  Eyes: Negative for visual disturbance.  Respiratory: Negative for cough and shortness of breath.   Cardiovascular: Negative for chest pain and leg swelling.  Gastrointestinal: Negative for abdominal pain, diarrhea, nausea and vomiting.  Skin: Negative for rash (Swelling right forearm).  Neurological: Negative for dizziness, weakness, light-headedness, numbness and headaches.    Per HPI unless specifically indicated above   Allergies as of 07/11/2018   No Known Allergies     Medication List        Accurate as of 07/11/18  8:34 AM. Always use your most recent med list.          cholecalciferol 1000 units tablet Commonly known as:  VITAMIN D Take 1,000 Units by mouth daily.   fluticasone 50 MCG/ACT nasal spray Commonly known as:  FLONASE Place 1 spray into both nostrils 2 (two) times daily as needed for allergies or rhinitis.   losartan 25 MG tablet Commonly known as:  COZAAR Take 1 tablet (25 mg total) daily by mouth.   metFORMIN 1000 MG tablet Commonly known as:  GLUCOPHAGE Take 1 tablet (1,000 mg total) by mouth 2 (two) times daily with a meal.   ONETOUCH VERIO test strip Generic drug:  glucose blood USE TO CHECK GLUCOSE TWICE DAILY   Potassium 99 MG Tabs Take 1 tablet by mouth daily.  Objective:    BP 138/84   Pulse 61   Temp 97.8 F (36.6 C) (Oral)   Ht '5\' 1"'  (1.549 m)   Wt 89.7 kg   BMI 37.37 kg/m   Wt Readings from Last 3 Encounters:  07/11/18 89.7 kg  11/19/17 92.1 kg  08/15/17 91.6 kg    Physical Exam  Constitutional: She is oriented to person, place, and time. She appears well-developed and well-nourished. No distress.  Eyes: Conjunctivae are normal.  Neck: Normal range of motion. Neck supple. No thyromegaly present.  Cardiovascular: Normal rate, regular rhythm, normal heart sounds and intact distal pulses.  Pulmonary/Chest: Effort normal and  breath sounds normal.  Abdominal: Soft. She exhibits no distension. There is no tenderness.  Feet:  Right Foot:  Skin Integrity: Positive for callus (Purple 35m macule on ball of 1st metatarsal without tenderness or warmth) and dry skin. Negative for ulcer, blister, skin breakdown, erythema or warmth.  Lymphadenopathy:    She has no cervical adenopathy.  Neurological: She is alert and oriented to person, place, and time. She displays normal reflexes. No sensory deficit.  Skin: Skin is warm and dry. She is not diaphoretic.     Psychiatric: She has a normal mood and affect. Her behavior is normal.  Nursing note and vitals reviewed.       Assessment & Plan:   Problem List Items Addressed This Visit      Cardiovascular and Mediastinum   Hypertension associated with diabetes ([place]   Relevant Medications   metFORMIN (GLUCOPHAGE) 1000 MG tablet   losartan (COZAAR) 25 MG tablet   amLODipine (NORVASC) 10 MG tablet   Other Relevant Orders   CMP14+EGFR (Completed)     Digestive   Gastroesophageal reflux disease without esophagitis   Relevant Orders   CBC with Differential/Platelet (Completed)     Endocrine   Type 2 diabetes mellitus (HCC) - Primary   Relevant Medications   metFORMIN (GLUCOPHAGE) 1000 MG tablet   losartan (COZAAR) 25 MG tablet   Other Relevant Orders   Bayer DCA Hb A1c Waived (Completed)   Lipid panel (Completed)    Other Visit Diagnoses    Need for immunization against influenza       Relevant Orders   Flu Vaccine QUAD 36+ mos IM (Completed)      Type 2 Diabetes Mellitus Continue taking Metformin. Follow up A1C ordered today. Depending on A1C level, we may add Januvia. We discussed the spot on her foot does not look worrisome and to keep it hydrated with lotion.  Likely a small bruise  Hypertension Continue taking Amlodipine and Losartan. Counseled her to keep taking her blood pressure at home.  Lipoma of right forearm Patient told to return if the area  gets larger or starts to become tender.  Follow up plan: Return in about 3 months (around 10/11/2018), or if symptoms worsen or fail to improve, for Diabetes and hypertension and GERD recheck.  Counseling provided for all of the vaccine components No orders of the defined types were placed in this encounter.  Patient seen and examined with MRejeana Brock PA student, agree with assessment and plan above. JCaryl Pina MD [facility] 07/11/2018, 8:34 AM

## 2018-10-14 ENCOUNTER — Encounter: Payer: Self-pay | Admitting: Family Medicine

## 2018-10-14 ENCOUNTER — Ambulatory Visit: Payer: 59 | Admitting: Family Medicine

## 2018-10-14 VITALS — BP 141/77 | HR 64 | Temp 97.3°F | Ht 61.0 in | Wt 196.4 lb

## 2018-10-14 DIAGNOSIS — K219 Gastro-esophageal reflux disease without esophagitis: Secondary | ICD-10-CM | POA: Diagnosis not present

## 2018-10-14 DIAGNOSIS — E1159 Type 2 diabetes mellitus with other circulatory complications: Secondary | ICD-10-CM | POA: Diagnosis not present

## 2018-10-14 DIAGNOSIS — E1129 Type 2 diabetes mellitus with other diabetic kidney complication: Secondary | ICD-10-CM | POA: Diagnosis not present

## 2018-10-14 DIAGNOSIS — I10 Essential (primary) hypertension: Secondary | ICD-10-CM

## 2018-10-14 DIAGNOSIS — Z794 Long term (current) use of insulin: Secondary | ICD-10-CM | POA: Diagnosis not present

## 2018-10-14 DIAGNOSIS — R1032 Left lower quadrant pain: Secondary | ICD-10-CM | POA: Diagnosis not present

## 2018-10-14 DIAGNOSIS — R809 Proteinuria, unspecified: Secondary | ICD-10-CM

## 2018-10-14 LAB — URINALYSIS, COMPLETE
Bilirubin, UA: NEGATIVE
GLUCOSE, UA: NEGATIVE
KETONES UA: NEGATIVE
NITRITE UA: POSITIVE — AB
Specific Gravity, UA: 1.01 (ref 1.005–1.030)
UUROB: 0.2 mg/dL (ref 0.2–1.0)
pH, UA: 5.5 (ref 5.0–7.5)

## 2018-10-14 LAB — MICROSCOPIC EXAMINATION: Renal Epithel, UA: NONE SEEN /hpf

## 2018-10-14 LAB — BAYER DCA HB A1C WAIVED: HB A1C (BAYER DCA - WAIVED): 5.9 % (ref <7.0)

## 2018-10-14 NOTE — Progress Notes (Signed)
BP (!) 141/77   Pulse 64   Temp (!) 97.3 F (36.3 C) (Oral)   Ht 5\' 1"  (1.549 m)   Wt 196 lb 6.4 oz (89.1 kg)   BMI 37.11 kg/m    Subjective:    Patient ID: Samantha Camacho, female    DOB: [birth year], 64 y.o.   MRN: [id number]  HPI: Samantha Camacho is a 63 y.o. female presenting on 10/14/2018 for Diabetes (3 month follow up); Hypertension; and Gastroesophageal Reflux   HPI Type 2 diabetes mellitus Patient comes in today for recheck of his diabetes. Patient has been currently taking metformin. Patient is currently on an ACE inhibitor/ARB. Patient has not seen an ophthalmologist this year. Patient denies any issues with their feet.   Hypertension Patient is currently on amlodipine and losartan, and their blood pressure today is 141/77. Patient denies any lightheadedness or dizziness. Patient denies headaches, blurred vision, chest pains, shortness of breath, or weakness. Denies any side effects from medication and is content with current medication.   GERD Patient is currently on no medication currently and has been doing well denies any issues with GERD [place].  She denies any major symptoms or abdominal pain or belching or burping. She denies any blood in her stool or lightheadedness or dizziness.   Left-sided abdominal pain Patient comes in complaining of left-sided abdominal pain that is been intermittent over the past month or 2.  She says sometimes it is in the lower part of her left abdomen and then sometimes in the upper part but most of the time it is in the lower part and currently today she complains of it mostly being in the lower part.  She denies any constipation or diarrhea or bowel problems.  She denies any urinary burning or dysuria hematuria.  She denies any blood in her stool.  She denies any fevers or chills but just has this discomfort.  And she does describe it as mild.  She is going is not there all the time.  Relevant past medical, surgical, family and social  history reviewed and updated as indicated. Interim medical history since our last visit reviewed. Allergies and medications reviewed and updated.  Review of Systems  Constitutional: Negative for chills and fever.  Eyes: Negative for visual disturbance.  Respiratory: Negative for chest tightness and shortness of breath.   Cardiovascular: Negative for chest pain and leg swelling.  Gastrointestinal: Positive for abdominal pain. Negative for constipation, diarrhea, nausea and vomiting.  Genitourinary: Negative for decreased urine volume, difficulty urinating, dysuria, hematuria and urgency.  Musculoskeletal: Negative for back pain and gait problem.  Skin: Negative for rash.  Neurological: Negative for light-headedness and headaches.  Psychiatric/Behavioral: Negative for agitation and behavioral problems.  All other systems reviewed and are negative.   Per HPI unless specifically indicated above   Allergies as of 10/14/2018   No Known Allergies     Medication List       Accurate as of October 14, 2018  8:46 AM. Always use your most recent med list.        amLODipine 10 MG tablet Commonly known as:  NORVASC Take 1 tablet (10 mg total) by mouth daily.   cholecalciferol 1000 units tablet Commonly known as:  VITAMIN D Take 1,000 Units by mouth daily.   fluticasone 50 MCG/ACT nasal spray Commonly known as:  FLONASE Place 1 spray into both nostrils 2 (two) times daily as needed for allergies or rhinitis.   losartan 25 MG tablet Commonly known as:  COZAAR Take 1 tablet (25 mg total) by mouth daily.   metFORMIN 1000 MG tablet Commonly known as:  GLUCOPHAGE Take 1 tablet (1,000 mg total) by mouth 2 (two) times daily with a meal.   ONETOUCH VERIO test strip Generic drug:  glucose blood USE TO CHECK GLUCOSE TWICE DAILY   Potassium 99 MG Tabs Take 1 tablet by mouth daily.          Objective:    BP (!) 141/77   Pulse 64   Temp (!) 97.3 F (36.3 C) (Oral)   Ht 5\' 1"   (1.549 m)   Wt 196 lb 6.4 oz (89.1 kg)   BMI 37.11 kg/m   Wt Readings from Last 3 Encounters:  10/14/18 196 lb 6.4 oz (89.1 kg)  07/11/18 197 lb 12.8 oz (89.7 kg)  11/19/17 203 lb (92.1 kg)    Physical Exam Vitals signs and nursing note reviewed.  Constitutional:      General: She is not in acute distress.    Appearance: She is well-developed. She is not diaphoretic.  Eyes:     Conjunctiva/sclera: Conjunctivae normal.  Cardiovascular:     Rate and Rhythm: Normal rate and regular rhythm.     Heart sounds: Normal heart sounds. No murmur.  Pulmonary:     Effort: Pulmonary effort is normal. No respiratory distress.     Breath sounds: Normal breath sounds. No wheezing.  Abdominal:     General: Abdomen is flat. Bowel sounds are normal. There is no distension.     Tenderness: There is abdominal tenderness (Left lower quadrant mild abdominal tenderness, no rebound or guarding, no CVA tenderness).  Musculoskeletal: Normal range of motion.        General: No tenderness.  Skin:    General: Skin is warm and dry.     Findings: No rash.  Neurological:     Mental Status: She is alert and oriented to person, place, and time.     Coordination: Coordination normal.  Psychiatric:        Behavior: Behavior normal.        Assessment & Plan:   Problem List Items Addressed This Visit      Cardiovascular and Mediastinum   Hypertension associated with diabetes ([place])     Digestive   Gastroesophageal reflux disease without esophagitis     Endocrine   Type 2 diabetes mellitus ([place]) - Primary   Relevant Orders   Bayer DCA Hb A1c Waived    Other Visit Diagnoses    LLQ abdominal pain       Relevant Orders   Urinalysis, Complete   Urine Culture   Microalbumin / creatinine urine ratio       Follow up plan: Return in about 3 months (around 01/13/2019), or if symptoms worsen or fail to improve, for Diabetes recheck.  Counseling provided for all of the vaccine components Orders Placed  This Encounter  Procedures  . Urine Culture  . Bayer DCA Hb A1c Waived  . Urinalysis, Complete  . Microalbumin / creatinine urine ratio    Caryl Pina, MD [facility] 10/14/2018, 8:46 AM

## 2018-10-15 LAB — MICROALBUMIN / CREATININE URINE RATIO
Creatinine, Urine: 73.5 mg/dL
Microalb/Creat Ratio: 124.1 mg/g creat — ABNORMAL HIGH (ref 0.0–30.0)
Microalbumin, Urine: 91.2 ug/mL

## 2018-10-16 ENCOUNTER — Telehealth: Payer: Self-pay | Admitting: Family Medicine

## 2018-10-16 MED ORDER — CEPHALEXIN 500 MG PO CAPS: 500.0000 mg | ORAL_CAPSULE | Freq: Four times a day (QID) | ORAL | 0 refills | Status: DC

## 2018-10-16 NOTE — Telephone Encounter (Signed)
Left message, script is ready.  You have urine infection.  Please call back and confirm receiving message.

## 2018-10-16 NOTE — Telephone Encounter (Signed)
Patient's urine does show signs of infection and she did grow E. coli, I sent Keflex an antibiotic for her, have her go ahead and start taking this, we are still waiting for the final culture to come back but for now start taking this antibiotic.

## 2018-10-16 NOTE — Telephone Encounter (Signed)
Dr Dettinger aware.

## 2018-10-17 ENCOUNTER — Telehealth: Payer: Self-pay | Admitting: Family Medicine

## 2018-10-17 LAB — URINE CULTURE

## 2018-10-17 MED ORDER — SULFAMETHOXAZOLE-TRIMETHOPRIM 800-160 MG PO TABS: 1.0000 | ORAL_TABLET | Freq: Two times a day (BID) | ORAL | 0 refills | Status: DC

## 2018-10-17 NOTE — Telephone Encounter (Signed)
Sent a different antibiotic for her because her urine grew a bacteria which was insensitive to the antibiotic that I gave her.  Have her stop that antibiotic and start this 1.

## 2018-10-18 NOTE — Telephone Encounter (Signed)
Aware. 

## 2018-10-18 NOTE — Telephone Encounter (Signed)
Patient aware and verbalizes understanding. 

## 2019-02-05 ENCOUNTER — Ambulatory Visit: Payer: 59 | Admitting: Family Medicine

## 2019-02-26 ENCOUNTER — Telehealth: Payer: Self-pay | Admitting: Family Medicine

## 2019-02-26 NOTE — Telephone Encounter (Signed)
LM pt needs to schedule 3 month recheck with PCP

## 2019-06-11 ENCOUNTER — Other Ambulatory Visit: Payer: Self-pay | Admitting: Family Medicine

## 2019-06-11 DIAGNOSIS — E1110 Type 2 diabetes mellitus with ketoacidosis without coma: Secondary | ICD-10-CM

## 2019-09-17 ENCOUNTER — Other Ambulatory Visit: Payer: Self-pay | Admitting: Family Medicine

## 2019-09-17 DIAGNOSIS — E1129 Type 2 diabetes mellitus with other diabetic kidney complication: Secondary | ICD-10-CM

## 2019-09-17 DIAGNOSIS — E1159 Type 2 diabetes mellitus with other circulatory complications: Secondary | ICD-10-CM

## 2019-09-17 DIAGNOSIS — I10 Essential (primary) hypertension: Secondary | ICD-10-CM

## 2019-09-30 ENCOUNTER — Telehealth: Payer: Self-pay | Admitting: Family Medicine

## 2019-09-30 DIAGNOSIS — E1129 Type 2 diabetes mellitus with other diabetic kidney complication: Secondary | ICD-10-CM

## 2019-09-30 DIAGNOSIS — E1159 Type 2 diabetes mellitus with other circulatory complications: Secondary | ICD-10-CM

## 2019-09-30 DIAGNOSIS — I152 Hypertension secondary to endocrine disorders: Secondary | ICD-10-CM

## 2019-09-30 NOTE — Telephone Encounter (Signed)
Pt says she will run out of her BP medicine at the end of January 2021. Wants to know if Dr Dettinger can call her in enough BP medicine to last her until she can come in for yearly exam on 11/17/2019.

## 2019-10-01 MED ORDER — LOSARTAN POTASSIUM 25 MG PO TABS: 25.0000 mg | ORAL_TABLET | Freq: Every day | ORAL | 0 refills | Status: DC

## 2019-10-01 MED ORDER — AMLODIPINE BESYLATE 10 MG PO TABS: 10.0000 mg | ORAL_TABLET | Freq: Every day | ORAL | 0 refills | Status: DC

## 2019-10-01 NOTE — Telephone Encounter (Signed)
Please let the patient know that I sent the blood pressure medication for her

## 2019-10-10 HISTORY — PX: CATARACT EXTRACTION, BILATERAL: SHX1313

## 2019-11-14 ENCOUNTER — Other Ambulatory Visit: Payer: Self-pay

## 2019-11-17 ENCOUNTER — Ambulatory Visit (INDEPENDENT_AMBULATORY_CARE_PROVIDER_SITE_OTHER): Payer: 59 | Admitting: Family Medicine

## 2019-11-17 ENCOUNTER — Encounter: Payer: Self-pay | Admitting: Family Medicine

## 2019-11-17 ENCOUNTER — Other Ambulatory Visit: Payer: Self-pay

## 2019-11-17 VITALS — BP 131/69 | HR 69 | Temp 99.6°F | Ht 61.0 in | Wt 196.6 lb

## 2019-11-17 DIAGNOSIS — E1129 Type 2 diabetes mellitus with other diabetic kidney complication: Secondary | ICD-10-CM | POA: Diagnosis not present

## 2019-11-17 DIAGNOSIS — R109 Unspecified abdominal pain: Secondary | ICD-10-CM

## 2019-11-17 DIAGNOSIS — E1159 Type 2 diabetes mellitus with other circulatory complications: Secondary | ICD-10-CM | POA: Diagnosis not present

## 2019-11-17 DIAGNOSIS — K219 Gastro-esophageal reflux disease without esophagitis: Secondary | ICD-10-CM

## 2019-11-17 DIAGNOSIS — Z01419 Encounter for gynecological examination (general) (routine) without abnormal findings: Secondary | ICD-10-CM

## 2019-11-17 DIAGNOSIS — I10 Essential (primary) hypertension: Secondary | ICD-10-CM

## 2019-11-17 DIAGNOSIS — R809 Proteinuria, unspecified: Secondary | ICD-10-CM

## 2019-11-17 LAB — BAYER DCA HB A1C WAIVED: HB A1C (BAYER DCA - WAIVED): 6 % (ref <7.0)

## 2019-11-17 LAB — URINALYSIS, COMPLETE
Bilirubin, UA: NEGATIVE
Glucose, UA: NEGATIVE
Ketones, UA: NEGATIVE
Nitrite, UA: NEGATIVE
Specific Gravity, UA: 1.015 (ref 1.005–1.030)
Urobilinogen, Ur: 0.2 mg/dL (ref 0.2–1.0)
pH, UA: 5.5 (ref 5.0–7.5)

## 2019-11-17 LAB — MICROSCOPIC EXAMINATION
Epithelial Cells (non renal): >10 /hpf — AB (ref 0–10)
WBC, UA: >30 /hpf — AB (ref 0–5)

## 2019-11-17 MED ORDER — AMLODIPINE BESYLATE 10 MG PO TABS: 10.0000 mg | ORAL_TABLET | Freq: Every day | ORAL | 3 refills | Status: DC

## 2019-11-17 MED ORDER — METFORMIN HCL 1000 MG PO TABS: 1000.0000 mg | ORAL_TABLET | Freq: Two times a day (BID) | ORAL | 3 refills | Status: DC

## 2019-11-17 MED ORDER — LOSARTAN POTASSIUM 25 MG PO TABS: 25.0000 mg | ORAL_TABLET | Freq: Every day | ORAL | 3 refills | Status: DC

## 2019-11-17 NOTE — Addendum Note (Signed)
Addended by: Caryl Pina on: 11/17/2019 10:09 AM   Modules accepted: Orders

## 2019-11-17 NOTE — Progress Notes (Addendum)
BP 131/69   Pulse 69   Temp 99.6 F (37.6 C) (Temporal)   Ht '5\' 1"'  (1.549 m)   Wt 196 lb 9.6 oz (89.2 kg)   SpO2 94%   BMI 37.15 kg/m    Subjective:   Patient ID: Samantha Camacho, female    DOB: 11-14-1955, 64 y.o.   MRN: [id number]  HPI: Naraya Stoneberg is a 64 y.o. female presenting on 11/17/2019 for Gynecologic Exam and Diabetes   HPI  Adult well exam and physical with gynecological exam Patient denies any chest pain, shortness of breath, headaches or vision issues, diarrhea, nausea, vomiting, or joint issues.  She denies any breast issues or vaginal issues.  The only complaint today and that she has that she is having some left abdominal pain/flank pain that comes and goes and has been an issue over the past few months.  She says she will feel it sometimes after eating and sometimes when she is up and moving around but then sometimes it comes at other times as well.  She denies any urinary issues or blood in her urine.  She says she feels it mostly on the left side and then around to her left flank.  Type 2 diabetes mellitus Patient comes in today for recheck of his diabetes. Patient has been currently taking Metformin. Patient is currently on an ACE inhibitor/ARB. Patient has not seen an ophthalmologist this year. Patient denies any issues with their feet.   Hypertension Patient is currently on losartan and amlodipine, and their blood pressure today is 131/69. Patient denies any lightheadedness or dizziness. Patient denies headaches, blurred vision, chest pains, shortness of breath, or weakness. Denies any side effects from medication and is content with current medication.   GERD Patient is currently on no medication currently.  She denies any major symptoms or abdominal pain or belching or burping. She denies any blood in her stool or lightheadedness or dizziness.   Relevant past medical, surgical, family and social history reviewed and updated as indicated. Interim medical  history since our last visit reviewed. Allergies and medications reviewed and updated.  Review of Systems  Constitutional: Negative for chills and fever.  HENT: Negative for congestion, ear discharge and ear pain.   Eyes: Negative for redness and visual disturbance.  Respiratory: Negative for chest tightness and shortness of breath.   Cardiovascular: Negative for chest pain and leg swelling.  Gastrointestinal: Positive for abdominal pain. Negative for constipation, diarrhea, nausea and vomiting.  Genitourinary: Positive for flank pain. Negative for difficulty urinating, dysuria, frequency, urgency, vaginal bleeding, vaginal discharge and vaginal pain.  Musculoskeletal: Negative for back pain and gait problem.  Skin: Negative for rash.  Neurological: Negative for light-headedness and headaches.  Psychiatric/Behavioral: Negative for agitation and behavioral problems.  All other systems reviewed and are negative.   Per HPI unless specifically indicated above   Allergies as of 11/17/2019   No Known Allergies     Medication List       Accurate as of November 17, 2019 10:09 AM. If you have any questions, ask your nurse or doctor.        STOP taking these medications   cephALEXin 500 MG capsule Commonly known as: KEFLEX Stopped by: Fransisca Kaufmann Esvin Hnat, MD   sulfamethoxazole-trimethoprim 800-160 MG tablet Commonly known as: BACTRIM DS Stopped by: Fransisca Kaufmann Ronya Gilcrest, MD     TAKE these medications   amLODipine 10 MG tablet Commonly known as: NORVASC Take 1 tablet (10 mg total) by mouth daily.  What changed: additional instructions Changed by: Fransisca Kaufmann Normagene Harvie, MD   cholecalciferol 1000 units tablet Commonly known as: VITAMIN D Take 1,000 Units by mouth daily.   fluticasone 50 MCG/ACT nasal spray Commonly known as: FLONASE Place 1 spray into both nostrils 2 (two) times daily as needed for allergies or rhinitis.   losartan 25 MG tablet Commonly known as: COZAAR Take 1  tablet (25 mg total) by mouth daily. What changed: additional instructions Changed by: Fransisca Kaufmann Sinan Tuch, MD   metFORMIN 1000 MG tablet Commonly known as: GLUCOPHAGE Take 1 tablet (1,000 mg total) by mouth 2 (two) times daily with a meal. What changed: additional instructions Changed by: Fransisca Kaufmann Mayeli Bornhorst, MD   OneTouch Verio test strip Generic drug: glucose blood Test glucose twice daily Dx E11.9   Potassium 99 MG Tabs Take 1 tablet by mouth daily.        Objective:   BP 131/69   Pulse 69   Temp 99.6 F (37.6 C) (Temporal)   Ht '5\' 1"'  (9.935 m)   Wt 196 lb 9.6 oz (89.2 kg)   SpO2 94%   BMI 37.15 kg/m   Wt Readings from Last 3 Encounters:  11/17/19 196 lb 9.6 oz (89.2 kg)  10/14/18 196 lb 6.4 oz (89.1 kg)  07/11/18 197 lb 12.8 oz (89.7 kg)    Physical Exam Vitals and nursing note reviewed. Exam conducted with a chaperone present.  Constitutional:      General: She is not in acute distress.    Appearance: She is well-developed. She is not diaphoretic.  Eyes:     Conjunctiva/sclera: Conjunctivae normal.     Pupils: Pupils are equal, round, and reactive to light.  Neck:     Thyroid: No thyromegaly.  Cardiovascular:     Rate and Rhythm: Normal rate and regular rhythm.     Heart sounds: Normal heart sounds. No murmur.  Pulmonary:     Effort: Pulmonary effort is normal. No respiratory distress.     Breath sounds: Normal breath sounds. No wheezing.  Chest:     Breasts: Breasts are symmetrical.        Right: No inverted nipple, mass, nipple discharge, skin change or tenderness.        Left: No inverted nipple, mass, nipple discharge, skin change or tenderness.  Abdominal:     General: Abdomen is flat. Bowel sounds are normal. There is no distension.     Palpations: Abdomen is soft.     Tenderness: There is abdominal tenderness (Left-sided tenderness). There is left CVA tenderness. There is no right CVA tenderness, guarding or rebound.  Genitourinary:    Exam  position: Lithotomy position.     Labia:        Right: No rash or lesion.        Left: No rash or lesion.      Vagina: Normal.     Comments: Cervix and uterus are surgically absent, no adnexal tenderness Musculoskeletal:        General: No tenderness. Normal range of motion.     Cervical back: Neck supple.  Lymphadenopathy:     Cervical: No cervical adenopathy.  Skin:    General: Skin is warm and dry.     Findings: No rash.  Neurological:     Mental Status: She is alert and oriented to person, place, and time.     Coordination: Coordination normal.  Psychiatric:        Behavior: Behavior normal.  Assessment & Plan:   Problem List Items Addressed This Visit      Cardiovascular and Mediastinum   Hypertension associated with diabetes ([place])   Relevant Medications   losartan (COZAAR) 25 MG tablet   amLODipine (NORVASC) 10 MG tablet   metFORMIN (GLUCOPHAGE) 1000 MG tablet   Other Relevant Orders   CMP14+EGFR     Digestive   Gastroesophageal reflux disease without esophagitis   Relevant Orders   CBC with Differential/Platelet     Endocrine   Type 2 diabetes mellitus ([facility])   Relevant Medications   losartan (COZAAR) 25 MG tablet   metFORMIN (GLUCOPHAGE) 1000 MG tablet   Other Relevant Orders   Microalbumin / creatinine urine ratio   Bayer DCA Hb A1c Waived   CMP14+EGFR   Lipid panel    Other Visit Diagnoses    Well woman exam with routine gynecological exam    -  Primary   Relevant Orders   IGP, Aptima HPV   Left flank discomfort       Relevant Orders   Urinalysis, Complete   Urine Culture      Will check urine for left flank pain, other considerations is possibly related to back and and nerve pain versus issues with her bowels although she denies any issues with her bowels.  We will start with urine and then consider work-up for the back Follow up plan: Return in about 3 months (around 02/14/2020), or if symptoms worsen or fail to improve, for  Diabetes.  Counseling provided for all of the vaccine components Orders Placed This Encounter  Procedures  . Urine Culture  . Microalbumin / creatinine urine ratio  . Bayer DCA Hb A1c Waived  . CBC with Differential/Platelet  . CMP14+EGFR  . Lipid panel  . Urinalysis, Complete    Caryl Pina, MD [facility] 11/17/2019, 10:08 AM

## 2019-11-18 LAB — CBC WITH DIFFERENTIAL/PLATELET
Basophils Absolute: 0.1 10*3/uL (ref 0.0–0.2)
Basos: 1 %
EOS (ABSOLUTE): 0.2 10*3/uL (ref 0.0–0.4)
Eos: 3 %
Hematocrit: 36.6 % (ref 34.0–46.6)
Hemoglobin: 12.2 g/dL (ref 11.1–15.9)
Immature Grans (Abs): 0 10*3/uL (ref 0.0–0.1)
Immature Granulocytes: 0 %
Lymphocytes Absolute: 2 10*3/uL (ref 0.7–3.1)
Lymphs: 32 %
MCH: 29.9 pg (ref 26.6–33.0)
MCHC: 33.3 g/dL (ref 31.5–35.7)
MCV: 90 fL (ref 79–97)
Monocytes Absolute: 0.4 10*3/uL (ref 0.1–0.9)
Monocytes: 6 %
Neutrophils Absolute: 3.5 10*3/uL (ref 1.4–7.0)
Neutrophils: 58 %
Platelets: 201 10*3/uL (ref 150–450)
RBC: 4.08 x10E6/uL (ref 3.77–5.28)
RDW: 13.5 % (ref 11.7–15.4)
WBC: 6.1 10*3/uL (ref 3.4–10.8)

## 2019-11-18 LAB — CMP14+EGFR
ALT: 18 IU/L (ref 0–32)
AST: 14 IU/L (ref 0–40)
Albumin/Globulin Ratio: 1.6 (ref 1.2–2.2)
Albumin: 4.6 g/dL (ref 3.8–4.8)
Alkaline Phosphatase: 57 IU/L (ref 39–117)
BUN/Creatinine Ratio: 17 (ref 12–28)
BUN: 12 mg/dL (ref 8–27)
Bilirubin Total: 0.2 mg/dL (ref 0.0–1.2)
CO2: 19 mmol/L — ABNORMAL LOW (ref 20–29)
Calcium: 9.3 mg/dL (ref 8.7–10.3)
Chloride: 109 mmol/L — ABNORMAL HIGH (ref 96–106)
Creatinine, Ser: 0.69 mg/dL (ref 0.57–1.00)
GFR calc Af Amer: 106 mL/min/{1.73_m2} (ref >59)
GFR calc non Af Amer: 92 mL/min/{1.73_m2} (ref >59)
Globulin, Total: 2.9 g/dL (ref 1.5–4.5)
Glucose: 116 mg/dL — ABNORMAL HIGH (ref 65–99)
Potassium: 3.7 mmol/L (ref 3.5–5.2)
Sodium: 143 mmol/L (ref 134–144)
Total Protein: 7.5 g/dL (ref 6.0–8.5)

## 2019-11-18 LAB — MICROALBUMIN / CREATININE URINE RATIO
Creatinine, Urine: 81 mg/dL
Microalb/Creat Ratio: 109 mg/g creat — ABNORMAL HIGH (ref 0–29)
Microalbumin, Urine: 88.3 ug/mL

## 2019-11-18 LAB — LIPID PANEL
Chol/HDL Ratio: 2.6 ratio (ref 0.0–4.4)
Cholesterol, Total: 175 mg/dL (ref 100–199)
HDL: 68 mg/dL (ref >39)
LDL Chol Calc (NIH): 96 mg/dL (ref 0–99)
Triglycerides: 57 mg/dL (ref 0–149)
VLDL Cholesterol Cal: 11 mg/dL (ref 5–40)

## 2019-11-20 LAB — IGP, APTIMA HPV: HPV Aptima: NEGATIVE

## 2019-11-21 ENCOUNTER — Other Ambulatory Visit: Payer: Self-pay | Admitting: Family Medicine

## 2019-11-21 LAB — URINE CULTURE

## 2019-11-21 MED ORDER — SULFAMETHOXAZOLE-TRIMETHOPRIM 800-160 MG PO TABS: 1.0000 | ORAL_TABLET | Freq: Two times a day (BID) | ORAL | 0 refills | Status: DC

## 2020-03-17 ENCOUNTER — Encounter: Payer: Self-pay | Admitting: Family Medicine

## 2020-03-17 ENCOUNTER — Ambulatory Visit (INDEPENDENT_AMBULATORY_CARE_PROVIDER_SITE_OTHER): Payer: Medicare Other | Admitting: Family Medicine

## 2020-03-17 ENCOUNTER — Other Ambulatory Visit: Payer: Self-pay

## 2020-03-17 VITALS — BP 127/64 | HR 75 | Temp 98.2°F | Ht 61.0 in | Wt 195.5 lb

## 2020-03-17 DIAGNOSIS — E1159 Type 2 diabetes mellitus with other circulatory complications: Secondary | ICD-10-CM

## 2020-03-17 DIAGNOSIS — K219 Gastro-esophageal reflux disease without esophagitis: Secondary | ICD-10-CM

## 2020-03-17 DIAGNOSIS — E1129 Type 2 diabetes mellitus with other diabetic kidney complication: Secondary | ICD-10-CM | POA: Diagnosis not present

## 2020-03-17 DIAGNOSIS — R809 Proteinuria, unspecified: Secondary | ICD-10-CM | POA: Diagnosis not present

## 2020-03-17 DIAGNOSIS — I10 Essential (primary) hypertension: Secondary | ICD-10-CM

## 2020-03-17 LAB — BAYER DCA HB A1C WAIVED: HB A1C (BAYER DCA - WAIVED): 5.7 % (ref <7.0)

## 2020-03-17 NOTE — Progress Notes (Signed)
BP 127/64   Pulse 75   Temp 98.2 F (36.8 C) (Temporal)   Ht 5\' 1"  (1.549 m)   Wt 195 lb 8 oz (88.7 kg)   BMI 36.94 kg/m    Subjective:   Patient ID: Samantha Camacho, female    DOB: 21-Jul-1956, 64 y.o.   MRN: [id number]  HPI: Samantha Camacho is a 64 y.o. female presenting on 03/17/2020 for Medical Management of Chronic Issues   HPI Type 2 diabetes mellitus Patient comes in today for recheck of his diabetes. Patient has been currently taking Metformin 1000 twice a day, A1c is 5.7 today, patient has been more active and she is down a couple pounds and try to change her diet for better. Patient is currently on an ACE inhibitor/ARB. Patient has seen an ophthalmologist this year. Patient denies any issues with their feet. The symptom started onset as an adult hypertension and GERD ARE RELATED TO DM   Hypertension Patient is currently on amlodipine and losartan, and their blood pressure today is 127/64. Patient denies any lightheadedness or dizziness. Patient denies headaches, blurred vision, chest pains, shortness of breath, or weakness. Denies any side effects from medication and is content with current medication.   GERD Patient is currently on no medication currently has been doing fine..  She denies any major symptoms or abdominal pain or belching or burping. She denies any blood in her stool or lightheadedness or dizziness.   Relevant past medical, surgical, family and social history reviewed and updated as indicated. Interim medical history since our last visit reviewed. Allergies and medications reviewed and updated.  Review of Systems  Constitutional: Negative for chills and fever.  Eyes: Negative for visual disturbance.  Respiratory: Negative for chest tightness and shortness of breath.   Cardiovascular: Negative for chest pain and leg swelling.  Gastrointestinal: Negative for abdominal pain, diarrhea and vomiting.  Musculoskeletal: Negative for back pain and gait problem.    Skin: Negative for rash.  Neurological: Negative for light-headedness and headaches.  Psychiatric/Behavioral: Negative for agitation and behavioral problems.  All other systems reviewed and are negative.   Per HPI unless specifically indicated above   Allergies as of 03/17/2020   No Known Allergies     Medication List       Accurate as of March 17, 2020  9:47 AM. If you have any questions, ask your nurse or doctor.        STOP taking these medications   sulfamethoxazole-trimethoprim 800-160 MG tablet Commonly known as: BACTRIM DS Stopped by: Fransisca Kaufmann Dashonna Chagnon, MD     TAKE these medications   amLODipine 10 MG tablet Commonly known as: NORVASC Take 1 tablet (10 mg total) by mouth daily.   cholecalciferol 1000 units tablet Commonly known as: VITAMIN D Take 1,000 Units by mouth daily.   fluticasone 50 MCG/ACT nasal spray Commonly known as: FLONASE Place 1 spray into both nostrils 2 (two) times daily as needed for allergies or rhinitis.   losartan 25 MG tablet Commonly known as: COZAAR Take 1 tablet (25 mg total) by mouth daily.   metFORMIN 1000 MG tablet Commonly known as: GLUCOPHAGE Take 1 tablet (1,000 mg total) by mouth 2 (two) times daily with a meal.   OneTouch Verio test strip Generic drug: glucose blood Test glucose twice daily Dx E11.9   Potassium 99 MG Tabs Take 1 tablet by mouth daily.        Objective:   BP 127/64   Pulse 75   Temp 98.2  F (36.8 C) (Temporal)   Ht 5\' 1"  (1.549 m)   Wt 195 lb 8 oz (88.7 kg)   BMI 36.94 kg/m   Wt Readings from Last 3 Encounters:  03/17/20 195 lb 8 oz (88.7 kg)  11/17/19 196 lb 9.6 oz (89.2 kg)  10/14/18 196 lb 6.4 oz (89.1 kg)    Physical Exam Vitals and nursing note reviewed.  Constitutional:      General: She is not in acute distress.    Appearance: She is well-developed. She is not diaphoretic.  Eyes:     Conjunctiva/sclera: Conjunctivae normal.  Cardiovascular:     Rate and Rhythm: Normal rate  and regular rhythm.     Heart sounds: Normal heart sounds. No murmur.  Pulmonary:     Effort: Pulmonary effort is normal. No respiratory distress.     Breath sounds: Normal breath sounds. No wheezing.  Musculoskeletal:        General: No tenderness. Normal range of motion.  Skin:    General: Skin is warm and dry.     Findings: No rash.  Neurological:     Mental Status: She is alert and oriented to person, place, and time.     Coordination: Coordination normal.  Psychiatric:        Behavior: Behavior normal.       Assessment & Plan:   Problem List Items Addressed This Visit      Cardiovascular and Mediastinum   Hypertension associated with diabetes ([place])     Digestive   Gastroesophageal reflux disease without esophagitis     Endocrine   Type 2 diabetes mellitus ([place]) - Primary   Relevant Orders   Bayer [place] Hb A1c Waived      Patient is doing really well, A1c is 5.7, recommended for her to cut her Metformin in half and only take 500 in the morning and 500 in the evening and if she continues to do well in 3 months we will cut it off completely. BP looks great, continue current medication. Follow up plan: Return in about 3 months (around 06/17/2020), or if symptoms worsen or fail to improve, for Diabetes recheck.  Counseling provided for all of the vaccine components Orders Placed This Encounter  Procedures  . Bayer [facility] Hb A1c [place], MD [facility] 03/17/2020, 9:47 AM

## 2020-05-05 LAB — HM DIABETES EYE EXAM

## 2020-05-10 ENCOUNTER — Other Ambulatory Visit: Payer: Self-pay

## 2020-05-10 ENCOUNTER — Ambulatory Visit (INDEPENDENT_AMBULATORY_CARE_PROVIDER_SITE_OTHER): Payer: Medicare Other | Admitting: Ophthalmology

## 2020-05-10 ENCOUNTER — Encounter (INDEPENDENT_AMBULATORY_CARE_PROVIDER_SITE_OTHER): Payer: Self-pay | Admitting: Ophthalmology

## 2020-05-10 DIAGNOSIS — H2511 Age-related nuclear cataract, right eye: Secondary | ICD-10-CM | POA: Diagnosis not present

## 2020-05-10 DIAGNOSIS — H35413 Lattice degeneration of retina, bilateral: Secondary | ICD-10-CM | POA: Insufficient documentation

## 2020-05-10 DIAGNOSIS — H2512 Age-related nuclear cataract, left eye: Secondary | ICD-10-CM | POA: Diagnosis not present

## 2020-05-10 DIAGNOSIS — H3530 Unspecified macular degeneration: Secondary | ICD-10-CM | POA: Diagnosis not present

## 2020-05-10 HISTORY — DX: Age-related nuclear cataract, left eye: H25.12

## 2020-05-10 HISTORY — DX: Age-related nuclear cataract, right eye: H25.11

## 2020-05-10 NOTE — Assessment & Plan Note (Signed)
No signs of retinal holes or tears.  Will observe.  Patient has no history of in the family of retinal tears or detachment.  She herself has not suffered such of calamity.  Thus there is no reason to proceed with laser retinopexy at this juncture.  She understands her lifetime risk of developing retinal detachment does not increase with cataract surgery but it does compress into the next 3 to 5 years.  Will notify either office promptly should new onset sparkling flashes of light or floaters develop.

## 2020-05-10 NOTE — Progress Notes (Signed)
05/10/2020     CHIEF COMPLAINT Patient presents for Retina Evaluation   HISTORY OF PRESENT ILLNESS: Samantha Camacho is a 64 y.o. female who presents to the clinic today for:   HPI    Retina Evaluation    In both eyes.  Duration of 1 week.  Associated Symptoms Floaters.  Negative for Flashes.  Context:  distance vision.          Comments    Referred by [facility] for cataract surgery Patient states that she is not currently having any problems.  A1C 5.7 /// LBS 120         Last edited by Gerda Diss on 05/10/2020  2:13 PM. (History)      Referring physician: Vevelyn Royals, MD No address on file  HISTORICAL INFORMATION:   Selected notes from the [facility]    Lab Results  Component Value Date   HGBA1C 5.7 03/17/2020     CURRENT MEDICATIONS: No current outpatient medications on file. (Ophthalmic Drugs)   No current facility-administered medications for this visit. (Ophthalmic Drugs)   Current Outpatient Medications (Other)  Medication Sig  . amLODipine (NORVASC) 10 MG tablet Take 1 tablet (10 mg total) by mouth daily.  . cholecalciferol (VITAMIN D) 1000 units tablet Take 1,000 Units by mouth daily.  . fluticasone (FLONASE) 50 MCG/ACT nasal spray Place 1 spray into both nostrils 2 (two) times daily as needed for allergies or rhinitis.  Marland Kitchen glucose blood (ONETOUCH VERIO) test strip Test glucose twice daily Dx E11.9  . losartan (COZAAR) 25 MG tablet Take 1 tablet (25 mg total) by mouth daily.  . metFORMIN (GLUCOPHAGE) 1000 MG tablet Take 1 tablet (1,000 mg total) by mouth 2 (two) times daily with a meal.  . Potassium 99 MG TABS Take 1 tablet by mouth daily.   No current facility-administered medications for this visit. (Other)      REVIEW OF SYSTEMS:    ALLERGIES No Known Allergies  PAST MEDICAL HISTORY Past Medical History:  Diagnosis Date  . Diabetes mellitus without complication ([place])   . Hypertension    Past Surgical  History:  Procedure Laterality Date  . ABDOMINAL HYSTERECTOMY    . CHOLECYSTECTOMY      FAMILY HISTORY Family History  Problem Relation Age of Onset  . Diabetes Mother   . Heart disease Mother   . Cancer Mother   . Diabetes Sister   . Diabetes Brother     SOCIAL HISTORY Social History   Tobacco Use  . Smoking status: Never Smoker  . Smokeless tobacco: Never Used  Vaping Use  . Vaping Use: Never used  Substance Use Topics  . Alcohol use: Yes    Comment: rarely  . Drug use: No         OPHTHALMIC EXAM:  Base Eye Exam    Visual Acuity (Snellen - Linear)      Right Left   Dist cc 20/200 20/60-1   Dist ph cc 20/60-2 20/40   Correction: Glasses       Tonometry (Tonopen, 2:17 PM)      Right Left   Pressure 17 13       Pupils      Pupils Dark Light Shape React APD   Right PERRL 4 3 Round Brisk None   Left PERRL 4 3 Round Brisk None       Visual Fields (Counting fingers)      Left Right    Full Full  Extraocular Movement      Right Left    Full Full       Neuro/Psych    Oriented x3: Yes   Mood/Affect: Normal       Dilation    Both eyes: 1.0% Mydriacyl, 2.5% Phenylephrine @ 2:17 PM        Slit Lamp and Fundus Exam    External Exam      Right Left   External Normal Normal       Slit Lamp Exam      Right Left   Lids/Lashes Normal Normal   Conjunctiva/Sclera White and quiet White and quiet   Cornea Clear Clear   Anterior Chamber Deep and quiet Deep and quiet   Iris Round and reactive Round and reactive   Lens 3+ Nuclear sclerosis, 1+ Posterior subcapsular cataract 3+ Nuclear sclerosis, 1+ Posterior subcapsular cataract   Anterior Vitreous Normal Normal       Fundus Exam      Right Left   Posterior Vitreous Normal Normal   Disc Normal Normal   C/D Ratio 0.25 0.25   Macula Normal Normal   Vessels Normal Normal   Periphery lattice 10 - 11, no holes or tears 25D lens with scleral depression lattice 3 - 4, no holes or tears, 25D lens  with scleral depression          IMAGING AND PROCEDURES  Imaging and Procedures for 05/10/20  OCT, Retina - OU - Both Eyes       Right Eye Quality was good. Scan locations included subfoveal. Central Foveal Thickness: 250. Progression has no prior data. Findings include normal foveal contour.   Left Eye Quality was good. Scan locations included subfoveal. Central Foveal Thickness: 248. Progression has no prior data. Findings include normal foveal contour.   Notes Bilateral medial opacity from cataract.  No evidence of posterior vitreous detachment on the OCT however view is somewhat limited through the medial opacity.  Normal foveal contour OU                ASSESSMENT/PLAN:  Lattice degeneration of retina, bilateral No signs of retinal holes or tears.  Will observe.  Patient has no history of in the family of retinal tears or detachment.  She herself has not suffered such of calamity.  Thus there is no reason to proceed with laser retinopexy at this juncture.  She understands her lifetime risk of developing retinal detachment does not increase with cataract surgery but it does compress into the next 3 to 5 years.  Will notify either office promptly should new onset sparkling flashes of light or floaters develop.  Nuclear sclerotic cataract of right eye Cataract(s) account for the patient's complaint. I discussed the risks and benefits of cataract surgery. Options were explained to the patient. The patient understands that new glasses may not improve their vision and desires to have cataract surgery. I have recommended follow up with their general eye care doctor for evaluation and consideration of cataract extraction with new intraocular lens insertion.  I agree with Dr. Vevelyn Royals that cataract surgery with intraocular lens placement is highly appropriate to recover visual acuity in each eye.      ICD-10-CM   1. Myopic macular degeneration of both eyes   H35.30 OCT, Retina - OU - Both Eyes  2. Nuclear sclerotic cataract of right eye  H25.11   3. Nuclear sclerotic cataract of left eye  H25.12   4. Lattice degeneration of retina, bilateral  H35.413  1.  No active maculopathy OU.  2.  No high risk features to the peripheral retina OU.  3.  Patient should follow up here after cataract surgery simply to assess the peripheral retina or as needed new symptoms  Ophthalmic Meds Ordered this visit:  No orders of the defined types were placed in this encounter.      Return in about 3 months (around 08/10/2020) for COLOR FP, DILATE OU, OCT.  There are no Patient Instructions on file for this visit.   Explained the diagnoses, plan, and follow up with the patient and they expressed understanding.  Patient expressed understanding of the importance of proper follow up care.   Clent Demark Roniesha Hollingshead M.D. Diseases & Surgery of the Retina and Vitreous Retina & Diabetic [place] 05/10/20     Abbreviations: M myopia (nearsighted); A astigmatism; H hyperopia (farsighted); P presbyopia; Mrx spectacle prescription;  CTL contact lenses; OD right eye; OS left eye; OU both eyes  XT exotropia; ET esotropia; PEK punctate epithelial keratitis; PEE punctate epithelial erosions; DES dry eye syndrome; MGD meibomian gland dysfunction; ATs artificial tears; PFAT's preservative free artificial tears; [place] nuclear sclerotic cataract; PSC posterior subcapsular cataract; ERM epi-retinal membrane; PVD posterior vitreous detachment; RD retinal detachment; DM diabetes mellitus; DR diabetic retinopathy; NPDR non-proliferative diabetic retinopathy; PDR proliferative diabetic retinopathy; CSME clinically significant macular edema; DME diabetic macular edema; dbh dot blot hemorrhages; CWS cotton wool spot; POAG primary open angle glaucoma; C/D cup-to-disc ratio; HVF humphrey visual field; GVF goldmann visual field; OCT optical coherence tomography; IOP intraocular pressure; BRVO  Branch retinal vein occlusion; CRVO central retinal vein occlusion; CRAO central retinal artery occlusion; BRAO branch retinal artery occlusion; RT retinal tear; SB scleral buckle; PPV pars plana vitrectomy; VH Vitreous hemorrhage; PRP panretinal laser photocoagulation; IVK intravitreal kenalog; VMT vitreomacular traction; MH Macular hole;  NVD neovascularization of the disc; NVE neovascularization elsewhere; AREDS age related eye disease study; ARMD age related macular degeneration; POAG primary open angle glaucoma; EBMD epithelial/anterior basement membrane dystrophy; ACIOL anterior chamber intraocular lens; IOL intraocular lens; PCIOL posterior chamber intraocular lens; Phaco/IOL phacoemulsification with intraocular lens placement; [place] photorefractive keratectomy; LASIK laser assisted in situ keratomileusis; HTN hypertension; DM diabetes mellitus; COPD chronic obstructive pulmonary disease

## 2020-05-10 NOTE — Assessment & Plan Note (Signed)
Cataract(s) account for the patient's complaint. I discussed the risks and benefits of cataract surgery. Options were explained to the patient. The patient understands that new glasses may not improve their vision and desires to have cataract surgery. I have recommended follow up with their general eye care doctor for evaluation and consideration of cataract extraction with new intraocular lens insertion.  I agree with Dr. Vevelyn Royals that cataract surgery with intraocular lens placement is highly appropriate to recover visual acuity in each eye.

## 2020-05-19 ENCOUNTER — Encounter (INDEPENDENT_AMBULATORY_CARE_PROVIDER_SITE_OTHER): Payer: Self-pay

## 2020-06-07 ENCOUNTER — Ambulatory Visit: Payer: Medicare Other | Admitting: Family Medicine

## 2020-08-10 ENCOUNTER — Encounter (INDEPENDENT_AMBULATORY_CARE_PROVIDER_SITE_OTHER): Payer: Medicare Other | Admitting: Ophthalmology

## 2020-08-26 ENCOUNTER — Ambulatory Visit (INDEPENDENT_AMBULATORY_CARE_PROVIDER_SITE_OTHER): Payer: Medicare Other | Admitting: Ophthalmology

## 2020-08-26 ENCOUNTER — Encounter (INDEPENDENT_AMBULATORY_CARE_PROVIDER_SITE_OTHER): Payer: Self-pay | Admitting: Ophthalmology

## 2020-08-26 ENCOUNTER — Other Ambulatory Visit: Payer: Self-pay

## 2020-08-26 DIAGNOSIS — Z794 Long term (current) use of insulin: Secondary | ICD-10-CM

## 2020-08-26 DIAGNOSIS — Z961 Presence of intraocular lens: Secondary | ICD-10-CM | POA: Insufficient documentation

## 2020-08-26 DIAGNOSIS — E1129 Type 2 diabetes mellitus with other diabetic kidney complication: Secondary | ICD-10-CM

## 2020-08-26 DIAGNOSIS — H3530 Unspecified macular degeneration: Secondary | ICD-10-CM

## 2020-08-26 DIAGNOSIS — R809 Proteinuria, unspecified: Secondary | ICD-10-CM

## 2020-08-26 DIAGNOSIS — H35413 Lattice degeneration of retina, bilateral: Secondary | ICD-10-CM | POA: Diagnosis not present

## 2020-08-26 NOTE — Assessment & Plan Note (Signed)

## 2020-08-26 NOTE — Assessment & Plan Note (Signed)
No new retinal holes, no high risk features

## 2020-08-26 NOTE — Progress Notes (Addendum)
08/26/2020     CHIEF COMPLAINT Patient presents for Retina Follow Up   HISTORY OF PRESENT ILLNESS: Samantha Camacho is a 64 y.o. female who presents to the clinic today for:   HPI    Retina Follow Up    Patient presents with  Other.  In both eyes.  This started 3 months ago.  Severity is mild.  Duration of 3 months.  Since onset it is stable.          Comments    3 Month F/U OU  Pt sts cataract surgery helped [facility] OU "a lot." No new symptoms reported OU.       Last edited by Rockie Neighbours, [place] on 08/26/2020  1:46 PM. (History)      Referring physician: Dettinger, Fransisca Kaufmann, MD [place],  [place] [address]  HISTORICAL INFORMATION:   Selected notes from the MEDICAL RECORD NUMBER    Lab Results  Component Value Date   HGBA1C 5.7 03/17/2020     CURRENT MEDICATIONS: No current outpatient medications on file. (Ophthalmic Drugs)   No current facility-administered medications for this visit. (Ophthalmic Drugs)   Current Outpatient Medications (Other)  Medication Sig  . amLODipine (NORVASC) 10 MG tablet Take 1 tablet (10 mg total) by mouth daily.  . cholecalciferol (VITAMIN D) 1000 units tablet Take 1,000 Units by mouth daily.  . fluticasone (FLONASE) 50 MCG/ACT nasal spray Place 1 spray into both nostrils 2 (two) times daily as needed for allergies or rhinitis.  Marland Kitchen glucose blood (ONETOUCH VERIO) test strip Test glucose twice daily Dx E11.9  . losartan (COZAAR) 25 MG tablet Take 1 tablet (25 mg total) by mouth daily.  . metFORMIN (GLUCOPHAGE) 1000 MG tablet Take 1 tablet (1,000 mg total) by mouth 2 (two) times daily with a meal.  . Potassium 99 MG TABS Take 1 tablet by mouth daily.   No current facility-administered medications for this visit. (Other)      REVIEW OF SYSTEMS:    ALLERGIES No Known Allergies  PAST MEDICAL HISTORY Past Medical History:  Diagnosis Date  . Diabetes mellitus without complication ([place])   . Hypertension   . Nuclear  sclerotic cataract of left eye 05/10/2020  . Nuclear sclerotic cataract of right eye 05/10/2020   Past Surgical History:  Procedure Laterality Date  . ABDOMINAL HYSTERECTOMY    . CHOLECYSTECTOMY      FAMILY HISTORY Family History  Problem Relation Age of Onset  . Diabetes Mother   . Heart disease Mother   . Cancer Mother   . Diabetes Sister   . Diabetes Brother     SOCIAL HISTORY Social History   Tobacco Use  . Smoking status: Never Smoker  . Smokeless tobacco: Never Used  Vaping Use  . Vaping Use: Never used  Substance Use Topics  . Alcohol use: Yes    Comment: rarely  . Drug use: No         OPHTHALMIC EXAM:  Base Eye Exam    Visual Acuity (ETDRS)      Right Left   Dist [place] 20/20 20/20 -1       Tonometry (Tonopen, 1:46 PM)      Right Left   Pressure 12 12       Pupils      Pupils Dark Light Shape React APD   Right PERRL 5 4 Round Brisk None   Left PERRL 5 4 Round Brisk None       Visual Fields (  Counting fingers)      Left Right    Full Full       Extraocular Movement      Right Left    Full Full       Neuro/Psych    Oriented x3: Yes   Mood/Affect: Normal       Dilation    Both eyes: 1.0% Mydriacyl, 2.5% Phenylephrine @ 1:49 PM        Slit Lamp and Fundus Exam    External Exam      Right Left   External Normal Normal       Slit Lamp Exam      Right Left   Lids/Lashes Normal Normal   Conjunctiva/Sclera White and quiet White and quiet   Cornea Clear Clear   Anterior Chamber Deep and quiet Deep and quiet   Iris Round and reactive Round and reactive   Lens Centered posterior chamber intraocular lens, 1+ Posterior capsular opacification,  Centrally in the visual axis Centered posterior chamber intraocular lens   Anterior Vitreous Normal Normal       Fundus Exam      Right Left   Posterior Vitreous Normal Normal   Disc Normal Normal   C/D Ratio 0.25 0.25   Macula Normal Normal   Vessels no DR no DR   Periphery lattice 10 - 11, no  holes or tears 25D lens with scleral depression lattice 3 - 4, no holes or tears, 25D lens with scleral depression          IMAGING AND PROCEDURES  Imaging and Procedures for 08/26/20  OCT, Retina - OU - Both Eyes       Right Eye Quality was good. Scan locations included subfoveal. Central Foveal Thickness: 251. Progression has been stable. Findings include normal foveal contour.   Left Eye Quality was good. Scan locations included subfoveal. Central Foveal Thickness: 251. Progression has been stable. Findings include normal foveal contour.   Notes Incidental posterior vitreous detachment       Color Fundus Photography Optos - OU - Both Eyes       Right Eye Progression has been stable. Disc findings include normal observations. Macula : normal observations. Vessels : normal observations. Periphery : normal observations.   Left Eye Progression has been stable. Disc findings include normal observations. Macula : normal observations. Vessels : normal observations. Periphery : normal observations.   Notes OS with vitreous strands of membranes.                ASSESSMENT/PLAN:  Type 2 diabetes mellitus ([place]) The patient has diabetes without any evidence of retinopathy. The patient advised to maintain good blood glucose control, excellent blood pressure control, and favorable levels of cholesterol, low density lipoprotein, and high density lipoproteins. Follow up in 1 year was recommended. Explained that fluctuations in visual acuity , or "out of focus", may result from large variations of blood sugar control.  Lattice degeneration of retina, bilateral No new retinal holes, no high risk features      ICD-10-CM   1. Myopic macular degeneration of both eyes  H35.30 OCT, Retina - OU - Both Eyes    Color Fundus Photography Optos - OU - Both Eyes  2. Lattice degeneration of retina, bilateral  H35.413 Color Fundus Photography Optos - OU - Both Eyes  3. Type 2 diabetes  mellitus with microalbuminuria, with long-term current use of insulin ([facility])  E11.29    R80.9    Z79.4   4. Pseudophakia  Z96.1     1.  Follow-up with Dr. Vevelyn Royals as scheduled  2.  Patient should receive PT evaluation here in 1 year for monitoring diabetic eye disease  3.yep  Ophthalmic Meds Ordered this visit:  No orders of the defined types were placed in this encounter.      Return in about 1 year (around 08/26/2021) for DILATE OU, COLOR FP.  There are no Patient Instructions on file for this visit.   Explained the diagnoses, plan, and follow up with the patient and they expressed understanding.  Patient expressed understanding of the importance of proper follow up care.   Clent Demark Tateanna Bach M.D. Diseases & Surgery of the Retina and Vitreous Retina & Diabetic [place] 08/26/20     Abbreviations: M myopia (nearsighted); A astigmatism; H hyperopia (farsighted); P presbyopia; Mrx spectacle prescription;  CTL contact lenses; OD right eye; OS left eye; OU both eyes  XT exotropia; ET esotropia; PEK punctate epithelial keratitis; PEE punctate epithelial erosions; DES dry eye syndrome; MGD meibomian gland dysfunction; ATs artificial tears; PFAT's preservative free artificial tears; [place] nuclear sclerotic cataract; PSC posterior subcapsular cataract; ERM epi-retinal membrane; PVD posterior vitreous detachment; RD retinal detachment; DM diabetes mellitus; DR diabetic retinopathy; NPDR non-proliferative diabetic retinopathy; PDR proliferative diabetic retinopathy; CSME clinically significant macular edema; DME diabetic macular edema; dbh dot blot hemorrhages; CWS cotton wool spot; POAG primary open angle glaucoma; C/D cup-to-disc ratio; HVF humphrey visual field; GVF goldmann visual field; OCT optical coherence tomography; IOP intraocular pressure; BRVO Branch retinal vein occlusion; CRVO central retinal vein occlusion; CRAO central retinal artery occlusion; BRAO branch retinal artery  occlusion; RT retinal tear; SB scleral buckle; PPV pars plana vitrectomy; VH Vitreous hemorrhage; PRP panretinal laser photocoagulation; IVK intravitreal kenalog; VMT vitreomacular traction; MH Macular hole;  NVD neovascularization of the disc; NVE neovascularization elsewhere; AREDS age related eye disease study; ARMD age related macular degeneration; POAG primary open angle glaucoma; EBMD epithelial/anterior basement membrane dystrophy; ACIOL anterior chamber intraocular lens; IOL intraocular lens; PCIOL posterior chamber intraocular lens; Phaco/IOL phacoemulsification with intraocular lens placement; [place] photorefractive keratectomy; LASIK laser assisted in situ keratomileusis; HTN hypertension; DM diabetes mellitus; COPD chronic obstructive pulmonary disease

## 2020-12-01 ENCOUNTER — Encounter: Payer: Self-pay | Admitting: Family Medicine

## 2020-12-01 ENCOUNTER — Other Ambulatory Visit: Payer: Self-pay

## 2020-12-01 ENCOUNTER — Ambulatory Visit (INDEPENDENT_AMBULATORY_CARE_PROVIDER_SITE_OTHER): Payer: Medicare Other | Admitting: Family Medicine

## 2020-12-01 VITALS — BP 129/69 | HR 74 | Ht 61.0 in | Wt 202.0 lb

## 2020-12-01 DIAGNOSIS — Z780 Asymptomatic menopausal state: Secondary | ICD-10-CM

## 2020-12-01 DIAGNOSIS — K219 Gastro-esophageal reflux disease without esophagitis: Secondary | ICD-10-CM

## 2020-12-01 DIAGNOSIS — E1129 Type 2 diabetes mellitus with other diabetic kidney complication: Secondary | ICD-10-CM | POA: Diagnosis not present

## 2020-12-01 DIAGNOSIS — I152 Hypertension secondary to endocrine disorders: Secondary | ICD-10-CM

## 2020-12-01 DIAGNOSIS — R809 Proteinuria, unspecified: Secondary | ICD-10-CM

## 2020-12-01 DIAGNOSIS — E1159 Type 2 diabetes mellitus with other circulatory complications: Secondary | ICD-10-CM | POA: Diagnosis not present

## 2020-12-01 DIAGNOSIS — Z1211 Encounter for screening for malignant neoplasm of colon: Secondary | ICD-10-CM

## 2020-12-01 LAB — BAYER DCA HB A1C WAIVED: HB A1C (BAYER DCA - WAIVED): 5.7 % (ref <7.0)

## 2020-12-01 NOTE — Progress Notes (Signed)
BP 129/69   Pulse 74   Ht '5\' 1"'  (1.549 m)   Wt 202 lb (91.6 kg)   BMI 38.17 kg/m    Subjective:   Patient ID: Samantha Camacho, female    DOB: 12/28/1955, 65 y.o.   MRN: [id number]  HPI: Samantha Camacho is a 65 y.o. female presenting on 12/01/2020 for Medical Management of Chronic Issues, Diabetes, and Hypertension   HPI Type 2 diabetes mellitus Patient comes in today for recheck of his diabetes. Patient has been currently taking Metformin. Patient is currently on an ACE inhibitor/ARB. Patient has not seen an ophthalmologist this year. Patient denies any issues with their feet. The symptom started onset as an adult hypertension ARE RELATED TO DM  Hypertension Patient is currently on amlodipine and losartan, and their blood pressure today is 129/69. Patient denies any lightheadedness or dizziness. Patient denies headaches, blurred vision, chest pains, shortness of breath, or weakness. Denies any side effects from medication and is content with current medication.   GERD Patient is currently on no medication currently and just takes over-the-counter stuff as needed..  She denies any major symptoms or abdominal pain or belching or burping. She denies any blood in her stool or lightheadedness or dizziness.   Relevant past medical, surgical, family and social history reviewed and updated as indicated. Interim medical history since our last visit reviewed. Allergies and medications reviewed and updated.  Review of Systems  Constitutional: Negative for chills and fever.  Eyes: Negative for visual disturbance.  Respiratory: Negative for chest tightness and shortness of breath.   Cardiovascular: Negative for chest pain and leg swelling.  Musculoskeletal: Negative for back pain and gait problem.  Skin: Negative for rash.  Neurological: Negative for light-headedness and headaches.  Psychiatric/Behavioral: Negative for agitation and behavioral problems.  All other systems reviewed and are  negative.   Per HPI unless specifically indicated above   Allergies as of 12/01/2020   No Known Allergies     Medication List       Accurate as of December 01, 2020  2:13 PM. If you have any questions, ask your nurse or doctor.        amLODipine 10 MG tablet Commonly known as: NORVASC Take 1 tablet (10 mg total) by mouth daily.   cholecalciferol 1000 units tablet Commonly known as: VITAMIN D Take 1,000 Units by mouth daily.   fluticasone 50 MCG/ACT nasal spray Commonly known as: FLONASE Place 1 spray into both nostrils 2 (two) times daily as needed for allergies or rhinitis.   losartan 25 MG tablet Commonly known as: COZAAR Take 1 tablet (25 mg total) by mouth daily.   metFORMIN 1000 MG tablet Commonly known as: GLUCOPHAGE Take 1 tablet (1,000 mg total) by mouth 2 (two) times daily with a meal.   OneTouch Verio test strip Generic drug: glucose blood Test glucose twice daily Dx E11.9   Potassium 99 MG Tabs Take 1 tablet by mouth daily.        Objective:   BP 129/69   Pulse 74   Ht '5\' 1"'  (1.549 m)   Wt 202 lb (91.6 kg)   BMI 38.17 kg/m   Wt Readings from Last 3 Encounters:  12/01/20 202 lb (91.6 kg)  03/17/20 195 lb 8 oz (88.7 kg)  11/17/19 196 lb 9.6 oz (89.2 kg)    Physical Exam Vitals and nursing note reviewed.  Constitutional:      General: She is not in acute distress.    Appearance: She  is well-developed and well-nourished. She is not diaphoretic.  Eyes:     Extraocular Movements: EOM normal.     Conjunctiva/sclera: Conjunctivae normal.  Cardiovascular:     Rate and Rhythm: Normal rate and regular rhythm.     Pulses: Intact distal pulses.     Heart sounds: Normal heart sounds. No murmur heard.   Pulmonary:     Effort: Pulmonary effort is normal. No respiratory distress.     Breath sounds: Normal breath sounds. No wheezing.  Musculoskeletal:        General: No tenderness or edema. Normal range of motion.  Skin:    General: Skin is  warm and dry.     Findings: No rash.  Neurological:     Mental Status: She is alert and oriented to person, place, and time.     Coordination: Coordination normal.  Psychiatric:        Mood and Affect: Mood and affect normal.        Behavior: Behavior normal.     Results for orders placed or performed in visit on 05/05/20  HM DIABETES EYE EXAM  Result Value Ref Range   HM Diabetic Eye Exam No Retinopathy No Retinopathy    Assessment & Plan:   Problem List Items Addressed This Visit      Cardiovascular and Mediastinum   Hypertension associated with diabetes ([place])   Relevant Orders   CBC with Differential/Platelet   CMP14+EGFR   Lipid panel   Bayer DCA Hb A1c Waived     Digestive   Gastroesophageal reflux disease without esophagitis     Endocrine   Type 2 diabetes mellitus ([place]) - Primary   Relevant Orders   CBC with Differential/Platelet   CMP14+EGFR   Lipid panel   Bayer DCA Hb A1c Waived    Other Visit Diagnoses    Colon cancer screening       Relevant Orders   Ambulatory referral to Gastroenterology   Postmenopausal       Relevant Orders   DG [facility] DEXA      A1c is 5.7 doing very well, continue diet exercise and will follow from there. Follow up plan: Return in about 3 months (around 02/28/2021), or if symptoms worsen or fail to improve, for Diabetes and hypertension cholesterol.  Counseling provided for all of the vaccine components Orders Placed This Encounter  Procedures  . DG [facility] DEXA  . CBC with Differential/Platelet  . CMP14+EGFR  . Lipid panel  . Bayer DCA Hb A1c Waived  . Ambulatory referral to Gastroenterology    Caryl Pina, MD [facility] 12/01/2020, 2:13 PM

## 2020-12-02 ENCOUNTER — Other Ambulatory Visit: Payer: Self-pay | Admitting: Family Medicine

## 2020-12-02 ENCOUNTER — Other Ambulatory Visit: Payer: Self-pay

## 2020-12-02 DIAGNOSIS — E1110 Type 2 diabetes mellitus with ketoacidosis without coma: Secondary | ICD-10-CM

## 2020-12-02 DIAGNOSIS — E1159 Type 2 diabetes mellitus with other circulatory complications: Secondary | ICD-10-CM

## 2020-12-02 DIAGNOSIS — I152 Hypertension secondary to endocrine disorders: Secondary | ICD-10-CM

## 2020-12-02 DIAGNOSIS — E1129 Type 2 diabetes mellitus with other diabetic kidney complication: Secondary | ICD-10-CM

## 2020-12-02 LAB — CMP14+EGFR
ALT: 27 IU/L (ref 0–32)
AST: 20 IU/L (ref 0–40)
Albumin/Globulin Ratio: 2 (ref 1.2–2.2)
Albumin: 4.9 g/dL — ABNORMAL HIGH (ref 3.8–4.8)
Alkaline Phosphatase: 58 IU/L (ref 44–121)
BUN/Creatinine Ratio: 18 (ref 12–28)
BUN: 12 mg/dL (ref 8–27)
Bilirubin Total: 0.3 mg/dL (ref 0.0–1.2)
CO2: 20 mmol/L (ref 20–29)
Calcium: 9.3 mg/dL (ref 8.7–10.3)
Chloride: 106 mmol/L (ref 96–106)
Creatinine, Ser: 0.65 mg/dL (ref 0.57–1.00)
GFR calc Af Amer: 108 mL/min/{1.73_m2} (ref >59)
GFR calc non Af Amer: 93 mL/min/{1.73_m2} (ref >59)
Globulin, Total: 2.5 g/dL (ref 1.5–4.5)
Glucose: 92 mg/dL (ref 65–99)
Potassium: 4.1 mmol/L (ref 3.5–5.2)
Sodium: 143 mmol/L (ref 134–144)
Total Protein: 7.4 g/dL (ref 6.0–8.5)

## 2020-12-02 LAB — CBC WITH DIFFERENTIAL/PLATELET
Basophils Absolute: 0.1 10*3/uL (ref 0.0–0.2)
Basos: 1 %
EOS (ABSOLUTE): 0.2 10*3/uL (ref 0.0–0.4)
Eos: 4 %
Hematocrit: 37.5 % (ref 34.0–46.6)
Hemoglobin: 12.4 g/dL (ref 11.1–15.9)
Immature Grans (Abs): 0 10*3/uL (ref 0.0–0.1)
Immature Granulocytes: 0 %
Lymphocytes Absolute: 2.2 10*3/uL (ref 0.7–3.1)
Lymphs: 33 %
MCH: 29.6 pg (ref 26.6–33.0)
MCHC: 33.1 g/dL (ref 31.5–35.7)
MCV: 90 fL (ref 79–97)
Monocytes Absolute: 0.5 10*3/uL (ref 0.1–0.9)
Monocytes: 8 %
Neutrophils Absolute: 3.7 10*3/uL (ref 1.4–7.0)
Neutrophils: 54 %
Platelets: 227 10*3/uL (ref 150–450)
RBC: 4.19 x10E6/uL (ref 3.77–5.28)
RDW: 13.8 % (ref 11.7–15.4)
WBC: 6.8 10*3/uL (ref 3.4–10.8)

## 2020-12-02 LAB — LIPID PANEL
Chol/HDL Ratio: 3.1 ratio (ref 0.0–4.4)
Cholesterol, Total: 190 mg/dL (ref 100–199)
HDL: 61 mg/dL (ref >39)
LDL Chol Calc (NIH): 110 mg/dL — ABNORMAL HIGH (ref 0–99)
Triglycerides: 107 mg/dL (ref 0–149)
VLDL Cholesterol Cal: 19 mg/dL (ref 5–40)

## 2020-12-02 MED ORDER — ROSUVASTATIN CALCIUM 10 MG PO TABS: 10.0000 mg | ORAL_TABLET | Freq: Every evening | ORAL | 3 refills | Status: DC

## 2020-12-02 MED ORDER — LOSARTAN POTASSIUM 25 MG PO TABS: 25.0000 mg | ORAL_TABLET | Freq: Every day | ORAL | 3 refills | Status: DC

## 2020-12-02 MED ORDER — METFORMIN HCL 1000 MG PO TABS: 1000.0000 mg | ORAL_TABLET | Freq: Two times a day (BID) | ORAL | 3 refills | Status: DC

## 2020-12-02 MED ORDER — AMLODIPINE BESYLATE 10 MG PO TABS: 10.0000 mg | ORAL_TABLET | Freq: Every day | ORAL | 3 refills | Status: DC

## 2020-12-13 ENCOUNTER — Encounter: Payer: Self-pay | Admitting: Internal Medicine

## 2021-01-14 ENCOUNTER — Encounter: Payer: Self-pay | Admitting: *Deleted

## 2021-02-07 ENCOUNTER — Other Ambulatory Visit: Payer: Self-pay

## 2021-02-07 ENCOUNTER — Ambulatory Visit (AMBULATORY_SURGERY_CENTER): Payer: Self-pay

## 2021-02-07 VITALS — Ht 61.0 in | Wt 208.0 lb

## 2021-02-07 DIAGNOSIS — Z1211 Encounter for screening for malignant neoplasm of colon: Secondary | ICD-10-CM

## 2021-02-07 DIAGNOSIS — Z800 Family history of malignant neoplasm of digestive organs: Secondary | ICD-10-CM

## 2021-02-07 MED ORDER — GOLYTELY 236 G PO SOLR: 4000.0000 mL | ORAL | 0 refills | Status: DC

## 2021-02-07 NOTE — Progress Notes (Signed)
No egg or soy allergy known to patient  No issues with past sedation with any surgeries or procedures Patient denies ever being told they had issues or difficulty with intubation  No FH of Malignant Hyperthermia No diet pills per patient No home 02 use per patient  No blood thinners per patient  Pt denies issues with constipation  No A fib or A flutter  EMMI video via MyChart  COVID 19 guidelines implemented in PV today with Pt and RN  NO PA's for preps discussed with pt in PV today  Discussed with pt there will be an out-of-pocket cost for prep and that varies from $0 to 70 dollars  Due to the COVID-19 pandemic we are asking patients to follow certain guidelines.  Pt aware of COVID protocols and [facility] guidelines

## 2021-02-16 ENCOUNTER — Encounter: Payer: Self-pay | Admitting: Internal Medicine

## 2021-02-16 ENCOUNTER — Other Ambulatory Visit: Payer: Self-pay

## 2021-02-16 ENCOUNTER — Ambulatory Visit (AMBULATORY_SURGERY_CENTER): Payer: Medicare Other | Admitting: Internal Medicine

## 2021-02-16 VITALS — BP 133/74 | HR 73 | Temp 98.0°F | Resp 18 | Ht 61.0 in | Wt 208.0 lb

## 2021-02-16 DIAGNOSIS — D124 Benign neoplasm of descending colon: Secondary | ICD-10-CM

## 2021-02-16 DIAGNOSIS — Z1211 Encounter for screening for malignant neoplasm of colon: Secondary | ICD-10-CM | POA: Diagnosis not present

## 2021-02-16 DIAGNOSIS — Z800 Family history of malignant neoplasm of digestive organs: Secondary | ICD-10-CM | POA: Diagnosis not present

## 2021-02-16 DIAGNOSIS — D122 Benign neoplasm of ascending colon: Secondary | ICD-10-CM

## 2021-02-16 MED ORDER — SODIUM CHLORIDE 0.9 % IV SOLN: 500.0000 mL | Freq: Once | INTRAVENOUS | Status: DC

## 2021-02-16 NOTE — Progress Notes (Signed)
pt tolerated well. VSS. awake and to recovery. Report given to RN.  

## 2021-02-16 NOTE — Op Note (Signed)
[facility] Patient Name: Samantha Camacho Procedure Date: 02/16/2021 10:53 AM MRN: [id number] Endoscopist: Jerene Bears , MD Age: 65 Referring MD:  Date of Birth: 04-09-1956 Gender: Female Account #: [id number] Procedure:                Colonoscopy Indications:              Screening patient at increased risk: Family history                            of 1st-degree relative with colorectal cancer at                            age 15 years (or older), Last colonoscopy: 2008 Medicines:                Monitored Anesthesia Care Procedure:                Pre-Anesthesia Assessment:                           - Prior to the procedure, a History and Physical                            was performed, and patient medications and                            allergies were reviewed. The patient's tolerance of                            previous anesthesia was also reviewed. The risks                            and benefits of the procedure and the sedation                            options and risks were discussed with the patient.                            All questions were answered, and informed consent                            was obtained. Prior Anticoagulants: The patient has                            taken no previous anticoagulant or antiplatelet                            agents. ASA Grade Assessment: II - A patient with                            mild systemic disease. After reviewing the risks                            and benefits, the patient was deemed in  satisfactory condition to undergo the procedure.                           After obtaining informed consent, the colonoscope                            was passed under direct vision. Throughout the                            procedure, the patient's blood pressure, pulse, and                            oxygen saturations were monitored continuously. The                            Olympus  PCF-H190DL ([id number]) Colonoscope was                            introduced through the anus and advanced to the                            cecum, identified by appendiceal orifice and                            ileocecal valve. The colonoscopy was performed                            without difficulty. The patient tolerated the                            procedure well. The quality of the bowel                            preparation was good. The ileocecal valve,                            appendiceal orifice, and rectum were photographed. Scope In: 10:59:14 AM Scope Out: 11:17:34 AM Scope Withdrawal Time: 0 hours 14 minutes 4 seconds  Total Procedure Duration: 0 hours 18 minutes 20 seconds  Findings:                 The digital rectal exam was normal.                           A 7 mm polyp was found in the ascending colon. The                            polyp was sessile. The polyp was removed with a                            cold snare. Resection and retrieval were complete.                           A 5 mm polyp was found in the descending colon.  The                            polyp was sessile. The polyp was removed with a                            cold snare. Resection and retrieval were complete.                           Multiple small and large-mouthed diverticula were                            found in the sigmoid colon, descending colon and                            hepatic flexure.                           Internal hemorrhoids were found during                            retroflexion. The hemorrhoids were small. Complications:            No immediate complications. Estimated Blood Loss:     Estimated blood loss was minimal. Impression:               - One 7 mm polyp in the ascending colon, removed                            with a cold snare. Resected and retrieved.                           - One 5 mm polyp in the descending colon, removed                             with a cold snare. Resected and retrieved.                           - Diverticulosis in the sigmoid colon, in the                            descending colon and at the hepatic flexure.                           - Internal hemorrhoids. Recommendation:           - Patient has a contact number available for                            emergencies. The signs and symptoms of potential                            delayed complications were discussed with the                            patient. Return to  normal activities tomorrow.                            Written discharge instructions were provided to the                            patient.                           - Resume previous diet.                           - Continue present medications.                           - Await pathology results.                           - Repeat colonoscopy in 5 years for surveillance. Jerene Bears, MD 02/16/2021 11:20:16 AM This report has been signed electronically.

## 2021-02-16 NOTE — Progress Notes (Signed)
Pt's states no medical or surgical changes since previsit or office visit.  CHECK-IN-AER  VITAL SIGNS-CW 

## 2021-02-16 NOTE — Patient Instructions (Signed)
YOU HAD AN ENDOSCOPIC PROCEDURE TODAY AT THE  ENDOSCOPY CENTER:   Refer to the procedure report that was given to you for any specific questions about what was found during the examination.  If the procedure report does not answer your questions, please call your gastroenterologist to clarify.  If you requested that your care partner not be given the details of your procedure findings, then the procedure report has been included in a sealed envelope for you to review at your convenience later.  YOU SHOULD EXPECT: Some feelings of bloating in the abdomen. Passage of more gas than usual.  Walking can help get rid of the air that was put into your GI tract during the procedure and reduce the bloating. If you had a lower endoscopy (such as a colonoscopy or flexible sigmoidoscopy) you may notice spotting of blood in your stool or on the toilet paper. If you underwent a bowel prep for your procedure, you may not have a normal bowel movement for a few days.  Please Note:  You might notice some irritation and congestion in your nose or some drainage.  This is from the oxygen used during your procedure.  There is no need for concern and it should clear up in a day or so.  SYMPTOMS TO REPORT IMMEDIATELY:   Following lower endoscopy (colonoscopy or flexible sigmoidoscopy):  Excessive amounts of blood in the stool  Significant tenderness or worsening of abdominal pains  Swelling of the abdomen that is new, acute  Fever of 100F or higher    For urgent or emergent issues, a gastroenterologist can be reached at any hour by calling [phone]. Do not use MyChart messaging for urgent concerns.    DIET:  We do recommend a small meal at first, but then you may proceed to your regular diet.  Drink plenty of fluids but you should avoid alcoholic beverages for 24 hours.  ACTIVITY:  You should plan to take it easy for the rest of today and you should NOT DRIVE or use heavy machinery until tomorrow  (because of the sedation medicines used during the test).    FOLLOW UP: Our staff will call the number listed on your records 48-72 hours following your procedure to check on you and address any questions or concerns that you may have regarding the information given to you following your procedure. If we do not reach you, we will leave a message.  We will attempt to reach you two times.  During this call, we will ask if you have developed any symptoms of COVID 19. If you develop any symptoms (ie: fever, flu-like symptoms, shortness of breath, cough etc.) before then, please call [phone].  If you test positive for Covid 19 in the 2 weeks post procedure, please call and report this information to us.    If any biopsies were taken you will be contacted by phone or by letter within the next 1-3 weeks.  Please call us at [phone] if you have not heard about the biopsies in 3 weeks.    SIGNATURES/CONFIDENTIALITY: You and/or your care partner have signed paperwork which will be entered into your electronic medical record.  These signatures attest to the fact that that the information above on your After Visit Summary has been reviewed and is understood.  Full responsibility of the confidentiality of this discharge information lies with you and/or your care-partner.   Resume medications. Information given on polyps,diverticulosis and hemorrhoids. 

## 2021-02-16 NOTE — Progress Notes (Signed)
Called to room to assist during endoscopic procedure.  Patient ID and intended procedure confirmed with present staff. Received instructions for my participation in the procedure from the performing physician.  

## 2021-02-18 ENCOUNTER — Telehealth: Payer: Self-pay

## 2021-02-18 NOTE — Telephone Encounter (Signed)
  Follow up Call-  Call back number 02/16/2021  Post procedure Call Back phone  # [phone]  Permission to leave phone message Yes  Some recent data might be hidden     Patient questions:  Do you have a fever, pain , or abdominal swelling? No. Pain Score  0 *  Have you tolerated food without any problems? Yes.    Have you been able to return to your normal activities? Yes.    Do you have any questions about your discharge instructions: Diet   No. Medications  No. Follow up visit  No.  Do you have questions or concerns about your Care? No.  Actions: * If pain score is 4 or above: No action needed, pain <4.  1. Have you developed a fever since your procedure? no  2.   Have you had an respiratory symptoms (SOB or cough) since your procedure? no  3.   Have you tested positive for COVID 19 since your procedure no   4.   Have you had any family members/close contacts diagnosed with the COVID 19 since your procedure?  No    If yes to any of these questions please route to Joylene John, RN and Joella Prince, RN

## 2021-02-25 ENCOUNTER — Other Ambulatory Visit: Payer: Self-pay

## 2021-02-25 ENCOUNTER — Encounter: Payer: Self-pay | Admitting: Family Medicine

## 2021-02-25 ENCOUNTER — Ambulatory Visit (INDEPENDENT_AMBULATORY_CARE_PROVIDER_SITE_OTHER): Payer: Medicare Other | Admitting: Family Medicine

## 2021-02-25 DIAGNOSIS — R319 Hematuria, unspecified: Secondary | ICD-10-CM

## 2021-02-25 DIAGNOSIS — R399 Unspecified symptoms and signs involving the genitourinary system: Secondary | ICD-10-CM | POA: Diagnosis not present

## 2021-02-25 LAB — URINALYSIS
Bilirubin, UA: NEGATIVE
Glucose, UA: NEGATIVE
Nitrite, UA: POSITIVE — AB
Specific Gravity, UA: 1.01 (ref 1.005–1.030)
Urobilinogen, Ur: 0.2 mg/dL (ref 0.2–1.0)
pH, UA: 5 (ref 5.0–7.5)

## 2021-02-25 MED ORDER — CEPHALEXIN 500 MG PO CAPS: 500.0000 mg | ORAL_CAPSULE | Freq: Four times a day (QID) | ORAL | 0 refills | Status: DC

## 2021-02-25 NOTE — Progress Notes (Signed)
Virtual Visit via telephone Note  I connected with Samantha Camacho on 02/25/21 at 1320 by telephone and verified that I am speaking with the correct person using two identifiers. Samantha Camacho is currently located at home and patient are currently with her during visit. The provider, Fransisca Kaufmann Aneesh Faller, MD is located in their office at time of visit.  Call ended at 1329  I discussed the limitations, risks, security and privacy concerns of performing an evaluation and management service by telephone and the availability of in person appointments. I also discussed with the patient that there may be a patient responsible charge related to this service. The patient expressed understanding and agreed to proceed.   History and Present Illness: Patient is calling in blood in her urine and pain in lower left back and moved to her left flank and now is in lower stomach.  She has frequency and clot.  The pain is improved now, and she passed something that looked like a small clot about 0300 today.  She denies fevers or chills.   Outpatient Encounter Medications as of 02/25/2021  Medication Sig  . amLODipine (NORVASC) 10 MG tablet Take 1 tablet (10 mg total) by mouth daily.  . cholecalciferol (VITAMIN D) 1000 units tablet Take 1,000 Units by mouth daily.  Marland Kitchen glucose blood (ONETOUCH VERIO) test strip Test glucose twice daily Dx E11.9  . losartan (COZAAR) 25 MG tablet Take 1 tablet (25 mg total) by mouth daily.  . metFORMIN (GLUCOPHAGE) 1000 MG tablet Take 1 tablet (1,000 mg total) by mouth 2 (two) times daily with a meal.  . Potassium 99 MG TABS Take 1 tablet by mouth daily.  . rosuvastatin (CRESTOR) 10 MG tablet Take 1 tablet (10 mg total) by mouth at bedtime.   No facility-administered encounter medications on file as of 02/25/2021.    Review of Systems  Constitutional: Negative for chills and fever.  Eyes: Negative for visual disturbance.  Respiratory: Negative for chest tightness and shortness  of breath.   Cardiovascular: Negative for chest pain and leg swelling.  Gastrointestinal: Positive for abdominal pain.  Genitourinary: Positive for dysuria, flank pain, frequency, hematuria and urgency. Negative for difficulty urinating, vaginal bleeding, vaginal discharge and vaginal pain.  Musculoskeletal: Negative for back pain and gait problem.  Skin: Negative for rash.  Neurological: Negative for light-headedness and headaches.  Psychiatric/Behavioral: Negative for agitation and behavioral problems.  All other systems reviewed and are negative.   Observations/Objective: Patient sounds comfortable and in no acute distress  Urinalysis: 6-10 WBCs, 11-30 RBCs, many bacteria, nitrite positive, 2+ leukocytes and 3+ blood and 1+ ketone  Assessment and Plan: Problem List Items Addressed This Visit   None   Visit Diagnoses    Hematuria, unspecified type       Relevant Medications   cephALEXin (KEFLEX) 500 MG capsule   UTI symptoms       Relevant Medications   cephALEXin (KEFLEX) 500 MG capsule     sent keflex to treat like UTI based on urinary infection and nitrite positive and sounds like she passed a kidney stone already.  Follow up plan: Return if symptoms worsen or fail to improve.     I discussed the assessment and treatment plan with the patient. The patient was provided an opportunity to ask questions and all were answered. The patient agreed with the plan and demonstrated an understanding of the instructions.   The patient was advised to call back or seek an in-person evaluation if the symptoms  worsen or if the condition fails to improve as anticipated.  The above assessment and management plan was discussed with the patient. The patient verbalized understanding of and has agreed to the management plan. Patient is aware to call the clinic if symptoms persist or worsen. Patient is aware when to return to the clinic for a follow-up visit. Patient educated on when it is  appropriate to go to the emergency department.    I provided 9 minutes of non-face-to-face time during this encounter.    Worthy Rancher, MD

## 2021-02-28 ENCOUNTER — Ambulatory Visit (INDEPENDENT_AMBULATORY_CARE_PROVIDER_SITE_OTHER): Payer: Medicare Other

## 2021-02-28 ENCOUNTER — Encounter: Payer: Self-pay | Admitting: Family Medicine

## 2021-02-28 ENCOUNTER — Ambulatory Visit (INDEPENDENT_AMBULATORY_CARE_PROVIDER_SITE_OTHER): Payer: Medicare Other | Admitting: Family Medicine

## 2021-02-28 ENCOUNTER — Other Ambulatory Visit: Payer: Self-pay

## 2021-02-28 VITALS — BP 139/80 | HR 76 | Ht 61.0 in | Wt 202.0 lb

## 2021-02-28 DIAGNOSIS — E1159 Type 2 diabetes mellitus with other circulatory complications: Secondary | ICD-10-CM

## 2021-02-28 DIAGNOSIS — E1110 Type 2 diabetes mellitus with ketoacidosis without coma: Secondary | ICD-10-CM

## 2021-02-28 DIAGNOSIS — K219 Gastro-esophageal reflux disease without esophagitis: Secondary | ICD-10-CM

## 2021-02-28 DIAGNOSIS — E785 Hyperlipidemia, unspecified: Secondary | ICD-10-CM

## 2021-02-28 DIAGNOSIS — E1169 Type 2 diabetes mellitus with other specified complication: Secondary | ICD-10-CM

## 2021-02-28 DIAGNOSIS — I152 Hypertension secondary to endocrine disorders: Secondary | ICD-10-CM

## 2021-02-28 DIAGNOSIS — R809 Proteinuria, unspecified: Secondary | ICD-10-CM | POA: Diagnosis not present

## 2021-02-28 DIAGNOSIS — E1129 Type 2 diabetes mellitus with other diabetic kidney complication: Secondary | ICD-10-CM

## 2021-02-28 DIAGNOSIS — Z780 Asymptomatic menopausal state: Secondary | ICD-10-CM | POA: Diagnosis not present

## 2021-02-28 LAB — BAYER DCA HB A1C WAIVED: HB A1C (BAYER DCA - WAIVED): 5.8 % (ref <7.0)

## 2021-02-28 MED ORDER — ONETOUCH VERIO VI STRP: ORAL_STRIP | 3 refills | Status: DC

## 2021-02-28 NOTE — Progress Notes (Signed)
BP 139/80   Pulse 76   Ht 5\' 1"  (1.549 m)   Wt 202 lb (91.6 kg)   SpO2 95%   BMI 38.17 kg/m    Subjective:   Patient ID: Samantha Camacho, female    DOB: [birth year], 65 y.o.   MRN: [id number]  HPI: Samantha Camacho is a 65 y.o. female presenting on 02/28/2021 for Medical Management of Chronic Issues and Diabetes   HPI Type 2 diabetes mellitus Patient comes in today for recheck of his diabetes. Patient has been currently taking metformin. Patient is currently on an ACE inhibitor/ARB. Patient has not seen an ophthalmologist this year. Patient denies any issues with their feet. The symptom started onset as an adult hypertension ARE RELATED TO DM   Hypertension Patient is currently on losartan and amlodipine, and their blood pressure today is 139/80. Patient denies any lightheadedness or dizziness. Patient denies headaches, blurred vision, chest pains, shortness of breath, or weakness. Denies any side effects from medication and is content with current medication.   Hyperlipidemia Patient is coming in for recheck of his hyperlipidemia. The patient is currently taking Crestor but she has not started taking it yet. They deny any issues with myalgias or history of liver damage from it. They deny any focal numbness or weakness or chest pain.   GERD Patient is currently on no medication currently denies any major issues.  She denies any major symptoms or abdominal pain or belching or burping. She denies any blood in her stool or lightheadedness or dizziness.   Patient's dysuria and other symptoms have greatly improved.  He says is gotten a lot better with the antibiotic.  Relevant past medical, surgical, family and social history reviewed and updated as indicated. Interim medical history since our last visit reviewed. Allergies and medications reviewed and updated.  Review of Systems  Constitutional: Negative for chills and fever.  Eyes: Negative for visual disturbance.  Respiratory:  Negative for chest tightness and shortness of breath.   Cardiovascular: Negative for chest pain and leg swelling.  Gastrointestinal: Negative for abdominal pain.  Genitourinary: Negative for difficulty urinating, dysuria, frequency and hematuria.  Musculoskeletal: Negative for back pain and gait problem.  Skin: Negative for rash.  Neurological: Negative for light-headedness and headaches.  Psychiatric/Behavioral: Negative for agitation and behavioral problems.  All other systems reviewed and are negative.   Per HPI unless specifically indicated above   Allergies as of 02/28/2021   No Known Allergies     Medication List       Accurate as of Feb 28, 2021  1:16 PM. If you have any questions, ask your nurse or doctor.        amLODipine 10 MG tablet Commonly known as: NORVASC Take 1 tablet (10 mg total) by mouth daily.   cephALEXin 500 MG capsule Commonly known as: KEFLEX Take 1 capsule (500 mg total) by mouth 4 (four) times daily.   cholecalciferol 1000 units tablet Commonly known as: VITAMIN D Take 1,000 Units by mouth daily.   losartan 25 MG tablet Commonly known as: COZAAR Take 1 tablet (25 mg total) by mouth daily.   metFORMIN 1000 MG tablet Commonly known as: GLUCOPHAGE Take 1 tablet (1,000 mg total) by mouth 2 (two) times daily with a meal.   OneTouch Verio test strip Generic drug: glucose blood Test glucose twice daily Dx E11.9   Potassium 99 MG Tabs Take 1 tablet by mouth daily.   rosuvastatin 10 MG tablet Commonly known as: Crestor Take 1 tablet (  10 mg total) by mouth at bedtime.        Objective:   BP 139/80   Pulse 76   Ht 5\' 1"  (1.549 m)   Wt 202 lb (91.6 kg)   SpO2 95%   BMI 38.17 kg/m   Wt Readings from Last 3 Encounters:  02/28/21 202 lb (91.6 kg)  02/16/21 208 lb (94.3 kg)  02/07/21 208 lb (94.3 kg)    Physical Exam Vitals and nursing note reviewed.  Constitutional:      General: She is not in acute distress.    Appearance: She  is well-developed. She is not diaphoretic.  Eyes:     Conjunctiva/sclera: Conjunctivae normal.  Cardiovascular:     Rate and Rhythm: Normal rate and regular rhythm.     Heart sounds: Normal heart sounds. No murmur heard.   Pulmonary:     Effort: Pulmonary effort is normal. No respiratory distress.     Breath sounds: Normal breath sounds. No wheezing.  Musculoskeletal:        General: No tenderness. Normal range of motion.  Skin:    General: Skin is warm and dry.     Findings: No rash.  Neurological:     Mental Status: She is alert and oriented to person, place, and time.     Coordination: Coordination normal.  Psychiatric:        Behavior: Behavior normal.       Assessment & Plan:   Problem List Items Addressed This Visit      Cardiovascular and Mediastinum   Hypertension associated with diabetes ([place])     Digestive   Gastroesophageal reflux disease without esophagitis     Endocrine   Type 2 diabetes mellitus ([place]) - Primary   Relevant Medications   glucose blood (ONETOUCH VERIO) test strip   Other Relevant Orders   Bayer DCA Hb A1c Waived   Hyperlipidemia associated with type 2 diabetes mellitus ([place])    Patient had not started taking the Crestor, recommended for her to start taking it, she had some concerns about we discussed that and she will try it.  A1c looks great at 5.8, continue current medication.  Follow up plan: Return in about 3 months (around 05/31/2021), or if symptoms worsen or fail to improve, for Diabetes and hypertension.  Counseling provided for all of the vaccine components Orders Placed This Encounter  Procedures  . Bayer [facility] Hb A1c [place], MD [facility] 02/28/2021, 1:16 PM

## 2021-03-01 ENCOUNTER — Ambulatory Visit: Payer: Medicare Other | Admitting: Family Medicine

## 2021-03-01 ENCOUNTER — Other Ambulatory Visit: Payer: Medicare Other

## 2021-03-01 ENCOUNTER — Encounter: Payer: Self-pay | Admitting: Internal Medicine

## 2021-03-03 ENCOUNTER — Other Ambulatory Visit: Payer: Self-pay | Admitting: Family Medicine

## 2021-03-03 DIAGNOSIS — E1110 Type 2 diabetes mellitus with ketoacidosis without coma: Secondary | ICD-10-CM

## 2021-03-03 LAB — URINE CULTURE

## 2021-03-04 ENCOUNTER — Other Ambulatory Visit: Payer: Self-pay | Admitting: Family Medicine

## 2021-03-04 MED ORDER — SULFAMETHOXAZOLE-TRIMETHOPRIM 800-160 MG PO TABS: 1.0000 | ORAL_TABLET | Freq: Two times a day (BID) | ORAL | 0 refills | Status: DC

## 2021-05-23 ENCOUNTER — Encounter: Payer: Self-pay | Admitting: Family Medicine

## 2021-05-23 ENCOUNTER — Ambulatory Visit (INDEPENDENT_AMBULATORY_CARE_PROVIDER_SITE_OTHER): Payer: Medicare Other | Admitting: Family Medicine

## 2021-05-23 ENCOUNTER — Other Ambulatory Visit: Payer: Self-pay

## 2021-05-23 VITALS — BP 141/72 | HR 69 | Ht 61.0 in | Wt 206.0 lb

## 2021-05-23 DIAGNOSIS — E1159 Type 2 diabetes mellitus with other circulatory complications: Secondary | ICD-10-CM

## 2021-05-23 DIAGNOSIS — R809 Proteinuria, unspecified: Secondary | ICD-10-CM

## 2021-05-23 DIAGNOSIS — I358 Other nonrheumatic aortic valve disorders: Secondary | ICD-10-CM | POA: Diagnosis not present

## 2021-05-23 DIAGNOSIS — E785 Hyperlipidemia, unspecified: Secondary | ICD-10-CM

## 2021-05-23 DIAGNOSIS — I152 Hypertension secondary to endocrine disorders: Secondary | ICD-10-CM

## 2021-05-23 DIAGNOSIS — E1129 Type 2 diabetes mellitus with other diabetic kidney complication: Secondary | ICD-10-CM | POA: Diagnosis not present

## 2021-05-23 DIAGNOSIS — E1169 Type 2 diabetes mellitus with other specified complication: Secondary | ICD-10-CM

## 2021-05-23 LAB — BAYER DCA HB A1C WAIVED: HB A1C (BAYER DCA - WAIVED): 5.6 % (ref <7.0)

## 2021-05-23 NOTE — Progress Notes (Signed)
BP (!) 141/72   Pulse 69   Ht '5\' 1"'  (1.549 m)   Wt 206 lb (93.4 kg)   SpO2 99%   BMI 38.92 kg/m    Subjective:   Patient ID: Samantha Camacho, female    DOB: May 12, 1956, 65 y.o.   MRN: [id number]  HPI: Samantha Camacho is a 65 y.o. female presenting on 05/23/2021 for Medical Management of Chronic Issues, Diabetes, and Hypertension   HPI Type 2 diabetes mellitus Patient comes in today for recheck of his diabetes. Patient has been currently taking metformin, A1c is 5.6. Patient is currently on an ACE inhibitor/ARB. Patient has seen an ophthalmologist this year. Patient denies any issues with their feet. The symptom started onset as an adult hypertension hyperlipidemia ARE RELATED TO DM   Hypertension Patient is currently on amlodipine and losartan, and their blood pressure today is 141/72. Patient denies any lightheadedness or dizziness. Patient denies headaches, blurred vision, chest pains, shortness of breath, or weakness. Denies any side effects from medication and is content with current medication.   Hyperlipidemia Patient is coming in for recheck of his hyperlipidemia. The patient is currently taking Crestor. They deny any issues with myalgias or history of liver damage from it. They deny any focal numbness or weakness or chest pain.   We noted a murmur in the last 2 times, patient has not had an echocardiogram since 2018  Relevant past medical, surgical, family and social history reviewed and updated as indicated. Interim medical history since our last visit reviewed. Allergies and medications reviewed and updated.  Review of Systems  Constitutional:  Negative for chills and fever.  Eyes:  Negative for visual disturbance.  Respiratory:  Negative for chest tightness and shortness of breath.   Cardiovascular:  Negative for chest pain, palpitations and leg swelling.  Musculoskeletal:  Negative for back pain and gait problem.  Skin:  Negative for rash.  Neurological:  Negative  for light-headedness and headaches.  Psychiatric/Behavioral:  Negative for agitation and behavioral problems.   All other systems reviewed and are negative.  Per HPI unless specifically indicated above   Allergies as of 05/23/2021   No Known Allergies      Medication List        Accurate as of May 23, 2021  2:22 PM. If you have any questions, ask your nurse or doctor.          STOP taking these medications    sulfamethoxazole-trimethoprim 800-160 MG tablet Commonly known as: BACTRIM DS Stopped by: Fransisca Kaufmann Sahid Borba, MD       TAKE these medications    amLODipine 10 MG tablet Commonly known as: NORVASC Take 1 tablet (10 mg total) by mouth daily.   cholecalciferol 1000 units tablet Commonly known as: VITAMIN D Take 1,000 Units by mouth daily.   losartan 25 MG tablet Commonly known as: COZAAR Take 1 tablet (25 mg total) by mouth daily.   metFORMIN 1000 MG tablet Commonly known as: GLUCOPHAGE Take 1 tablet (1,000 mg total) by mouth 2 (two) times daily with a meal.   OneTouch Verio test strip Generic drug: glucose blood USE 1 STRIP TO CHECK GLUCOSE DAILY   Potassium 99 MG Tabs Take 1 tablet by mouth daily.   rosuvastatin 10 MG tablet Commonly known as: Crestor Take 1 tablet (10 mg total) by mouth at bedtime.         Objective:   BP (!) 141/72   Pulse 69   Ht '5\' 1"'  (1.549 m)  Wt 206 lb (93.4 kg)   SpO2 99%   BMI 38.92 kg/m   Wt Readings from Last 3 Encounters:  05/23/21 206 lb (93.4 kg)  02/28/21 202 lb (91.6 kg)  02/16/21 208 lb (94.3 kg)    Physical Exam Vitals and nursing note reviewed.  Constitutional:      General: She is not in acute distress.    Appearance: She is well-developed. She is not diaphoretic.  Eyes:     Conjunctiva/sclera: Conjunctivae normal.  Cardiovascular:     Rate and Rhythm: Normal rate and regular rhythm.     Heart sounds: Murmur heard.  Crescendo decrescendo systolic murmur is present with a grade of 3/6.   Pulmonary:     Effort: Pulmonary effort is normal. No respiratory distress.     Breath sounds: Normal breath sounds. No wheezing.  Musculoskeletal:        General: No tenderness. Normal range of motion.  Skin:    General: Skin is warm and dry.     Findings: No rash.  Neurological:     Mental Status: She is alert and oriented to person, place, and time.     Coordination: Coordination normal.  Psychiatric:        Behavior: Behavior normal.      Assessment & Plan:   Problem List Items Addressed This Visit       Cardiovascular and Mediastinum   Hypertension associated with diabetes ([place])   Relevant Orders   CBC with Differential/Platelet   CMP14+EGFR   Lipid panel   Bayer DCA Hb A1c Waived     Endocrine   Type 2 diabetes mellitus ([place]) - Primary   Relevant Orders   CBC with Differential/Platelet   CMP14+EGFR   Lipid panel   Bayer DCA Hb A1c Waived   Hyperlipidemia associated with type 2 diabetes mellitus ([place])   Other Visit Diagnoses     Systolic murmur of aorta       Relevant Orders   ECHOCARDIOGRAM COMPLETE     Encourage patient that she can back off her metformin, its been a few years since she had an echocardiogram on her murmur so we will repeat that.  Follow up plan: Return in about 3 months (around 08/23/2021), or if symptoms worsen or fail to improve, for Diabetes and hypertension and cholesterol.  Counseling provided for all of the vaccine components Orders Placed This Encounter  Procedures   CBC with Differential/Platelet   CMP14+EGFR   Lipid panel   Bayer DCA Hb A1c Waived   ECHOCARDIOGRAM COMPLETE    Caryl Pina, MD [facility] 05/23/2021, 2:22 PM

## 2021-05-24 LAB — CMP14+EGFR
ALT: 20 IU/L (ref 0–32)
AST: 18 IU/L (ref 0–40)
Albumin/Globulin Ratio: 2 (ref 1.2–2.2)
Albumin: 4.9 g/dL — ABNORMAL HIGH (ref 3.8–4.8)
Alkaline Phosphatase: 60 IU/L (ref 44–121)
BUN/Creatinine Ratio: 15 (ref 12–28)
BUN: 10 mg/dL (ref 8–27)
Bilirubin Total: 0.3 mg/dL (ref 0.0–1.2)
CO2: 23 mmol/L (ref 20–29)
Calcium: 9.3 mg/dL (ref 8.7–10.3)
Chloride: 107 mmol/L — ABNORMAL HIGH (ref 96–106)
Creatinine, Ser: 0.67 mg/dL (ref 0.57–1.00)
Globulin, Total: 2.5 g/dL (ref 1.5–4.5)
Glucose: 99 mg/dL (ref 65–99)
Potassium: 4.1 mmol/L (ref 3.5–5.2)
Sodium: 144 mmol/L (ref 134–144)
Total Protein: 7.4 g/dL (ref 6.0–8.5)
eGFR: 96 mL/min/{1.73_m2} (ref >59)

## 2021-05-24 LAB — LIPID PANEL
Chol/HDL Ratio: 2 ratio (ref 0.0–4.4)
Cholesterol, Total: 120 mg/dL (ref 100–199)
HDL: 59 mg/dL (ref >39)
LDL Chol Calc (NIH): 47 mg/dL (ref 0–99)
Triglycerides: 67 mg/dL (ref 0–149)
VLDL Cholesterol Cal: 14 mg/dL (ref 5–40)

## 2021-05-24 LAB — CBC WITH DIFFERENTIAL/PLATELET
Basophils Absolute: 0.1 10*3/uL (ref 0.0–0.2)
Basos: 1 %
EOS (ABSOLUTE): 0.3 10*3/uL (ref 0.0–0.4)
Eos: 4 %
Hematocrit: 34.5 % (ref 34.0–46.6)
Hemoglobin: 11.1 g/dL (ref 11.1–15.9)
Immature Grans (Abs): 0 10*3/uL (ref 0.0–0.1)
Immature Granulocytes: 0 %
Lymphocytes Absolute: 2.5 10*3/uL (ref 0.7–3.1)
Lymphs: 33 %
MCH: 29.7 pg (ref 26.6–33.0)
MCHC: 32.2 g/dL (ref 31.5–35.7)
MCV: 92 fL (ref 79–97)
Monocytes Absolute: 0.5 10*3/uL (ref 0.1–0.9)
Monocytes: 7 %
Neutrophils Absolute: 4.2 10*3/uL (ref 1.4–7.0)
Neutrophils: 55 %
Platelets: 180 10*3/uL (ref 150–450)
RBC: 3.74 x10E6/uL — ABNORMAL LOW (ref 3.77–5.28)
RDW: 14.2 % (ref 11.7–15.4)
WBC: 7.6 10*3/uL (ref 3.4–10.8)

## 2021-07-28 ENCOUNTER — Ambulatory Visit: Payer: Medicare Other | Admitting: Physician Assistant

## 2021-08-24 ENCOUNTER — Ambulatory Visit (INDEPENDENT_AMBULATORY_CARE_PROVIDER_SITE_OTHER): Payer: Medicare Other | Admitting: Family Medicine

## 2021-08-24 ENCOUNTER — Encounter: Payer: Self-pay | Admitting: Family Medicine

## 2021-08-24 ENCOUNTER — Other Ambulatory Visit: Payer: Self-pay

## 2021-08-24 VITALS — BP 143/80 | HR 66 | Ht 61.0 in | Wt 202.0 lb

## 2021-08-24 DIAGNOSIS — E785 Hyperlipidemia, unspecified: Secondary | ICD-10-CM

## 2021-08-24 DIAGNOSIS — E1129 Type 2 diabetes mellitus with other diabetic kidney complication: Secondary | ICD-10-CM

## 2021-08-24 DIAGNOSIS — E1169 Type 2 diabetes mellitus with other specified complication: Secondary | ICD-10-CM | POA: Diagnosis not present

## 2021-08-24 DIAGNOSIS — Z1231 Encounter for screening mammogram for malignant neoplasm of breast: Secondary | ICD-10-CM | POA: Diagnosis not present

## 2021-08-24 DIAGNOSIS — I152 Hypertension secondary to endocrine disorders: Secondary | ICD-10-CM

## 2021-08-24 DIAGNOSIS — K219 Gastro-esophageal reflux disease without esophagitis: Secondary | ICD-10-CM | POA: Diagnosis not present

## 2021-08-24 DIAGNOSIS — R809 Proteinuria, unspecified: Secondary | ICD-10-CM

## 2021-08-24 DIAGNOSIS — Z23 Encounter for immunization: Secondary | ICD-10-CM

## 2021-08-24 DIAGNOSIS — E1159 Type 2 diabetes mellitus with other circulatory complications: Secondary | ICD-10-CM

## 2021-08-24 LAB — BAYER DCA HB A1C WAIVED: HB A1C (BAYER DCA - WAIVED): 6.1 % — ABNORMAL HIGH (ref 4.8–5.6)

## 2021-08-24 NOTE — Addendum Note (Signed)
Addended by: Alphonzo Dublin on: 08/24/2021 02:57 PM   Modules accepted: Orders

## 2021-08-24 NOTE — Progress Notes (Signed)
BP (!) 143/80   Pulse 66   Ht 5\' 1"  (1.549 m)   Wt 202 lb (91.6 kg)   SpO2 97%   BMI 38.17 kg/m    Subjective:   Patient ID: Samantha Camacho, female    DOB: [birth year], 65 y.o.   MRN: [id number]  HPI: Samantha Camacho is a 65 y.o. female presenting on 08/24/2021 for Medical Management of Chronic Issues and Diabetes   HPI Type 2 diabetes mellitus Patient comes in today for recheck of his diabetes. Patient has been currently taking metformin, A1c 6.1. Patient is currently on an ACE inhibitor/ARB. Patient has not seen an ophthalmologist this year. Patient denies any issues with their feet. The symptom started onset as an adult hypertension and hyperlipidemia ARE RELATED TO DM   Hypertension Patient is currently on amlodipine and losartan, and their blood pressure today is 143/80. Patient denies any lightheadedness or dizziness. Patient denies headaches, blurred vision, chest pains, shortness of breath, or weakness. Denies any side effects from medication and is content with current medication.   Hyperlipidemia Patient is coming in for recheck of his hyperlipidemia. The patient is currently taking Crestor. They deny any issues with myalgias or history of liver damage from it. They deny any focal numbness or weakness or chest pain.   GERD Patient is currently on no medication and has occasional symptoms, she uses Tums as needed and says in the evening she has been having a little more reflux and acid.  She denies any blood in her stool.  She denies any blood in her stool or lightheadedness or dizziness.   Relevant past medical, surgical, family and social history reviewed and updated as indicated. Interim medical history since our last visit reviewed. Allergies and medications reviewed and updated.  Review of Systems  Constitutional:  Negative for chills and fever.  HENT:  Negative for congestion.   Eyes:  Negative for visual disturbance.  Respiratory:  Negative for chest tightness  and shortness of breath.   Cardiovascular:  Negative for chest pain and leg swelling.  Musculoskeletal:  Negative for back pain and gait problem.  Skin:  Negative for rash.  Neurological:  Negative for light-headedness and headaches.  Psychiatric/Behavioral:  Negative for agitation and behavioral problems.   All other systems reviewed and are negative.  Per HPI unless specifically indicated above   Allergies as of 08/24/2021   No Known Allergies      Medication List        Accurate as of August 24, 2021  2:13 PM. If you have any questions, ask your nurse or doctor.          amLODipine 10 MG tablet Commonly known as: NORVASC Take 1 tablet (10 mg total) by mouth daily.   cholecalciferol 1000 units tablet Commonly known as: VITAMIN D Take 1,000 Units by mouth daily.   losartan 25 MG tablet Commonly known as: COZAAR Take 1 tablet (25 mg total) by mouth daily.   metFORMIN 1000 MG tablet Commonly known as: GLUCOPHAGE Take 1 tablet (1,000 mg total) by mouth 2 (two) times daily with a meal.   OneTouch Verio test strip Generic drug: glucose blood USE 1 STRIP TO CHECK GLUCOSE DAILY   Potassium 99 MG Tabs Take 1 tablet by mouth daily.   rosuvastatin 10 MG tablet Commonly known as: Crestor Take 1 tablet (10 mg total) by mouth at bedtime.         Objective:   BP (!) 143/80   Pulse 66  Ht 5\' 1"  (1.549 m)   Wt 202 lb (91.6 kg)   SpO2 97%   BMI 38.17 kg/m   Wt Readings from Last 3 Encounters:  08/24/21 202 lb (91.6 kg)  05/23/21 206 lb (93.4 kg)  02/28/21 202 lb (91.6 kg)    Physical Exam Vitals and nursing note reviewed.  Constitutional:      General: She is not in acute distress.    Appearance: She is well-developed. She is not diaphoretic.  Eyes:     Conjunctiva/sclera: Conjunctivae normal.  Cardiovascular:     Rate and Rhythm: Normal rate and regular rhythm.     Heart sounds: Normal heart sounds. No murmur heard. Pulmonary:     Effort:  Pulmonary effort is normal. No respiratory distress.     Breath sounds: Normal breath sounds. No wheezing.  Musculoskeletal:        General: No tenderness. Normal range of motion.  Skin:    General: Skin is warm and dry.     Findings: No rash.  Neurological:     Mental Status: She is alert and oriented to person, place, and time.     Coordination: Coordination normal.  Psychiatric:        Behavior: Behavior normal.      Assessment & Plan:   Problem List Items Addressed This Visit       Cardiovascular and Mediastinum   Hypertension associated with diabetes ([place])     Digestive   Gastroesophageal reflux disease without esophagitis     Endocrine   Type 2 diabetes mellitus ([place]) - Primary   Relevant Orders   Bayer DCA Hb A1c Waived   Hyperlipidemia associated with type 2 diabetes mellitus ([place])   Other Visit Diagnoses     Encounter for screening mammogram for malignant neoplasm of breast       Relevant Orders   MM 3D SCREEN BREAST BILATERAL     Patient's A1c is 6.1 looks good, recommended Pepcid AC over-the-counter for acid reflux.  Follow up plan: Return in about 3 months (around 11/24/2021), or if symptoms worsen or fail to improve, for Diabetes and hypertension cholesterol.  Counseling provided for all of the vaccine components Orders Placed This Encounter  Procedures   MM 3D [place] Hb A1c [place] Samantha Paullin, MD [facility] 08/24/2021, 2:13 PM

## 2021-08-29 ENCOUNTER — Other Ambulatory Visit: Payer: Self-pay

## 2021-08-29 ENCOUNTER — Ambulatory Visit (INDEPENDENT_AMBULATORY_CARE_PROVIDER_SITE_OTHER): Payer: Medicare Other | Admitting: Ophthalmology

## 2021-08-29 ENCOUNTER — Encounter (INDEPENDENT_AMBULATORY_CARE_PROVIDER_SITE_OTHER): Payer: Self-pay | Admitting: Ophthalmology

## 2021-08-29 DIAGNOSIS — H3530 Unspecified macular degeneration: Secondary | ICD-10-CM

## 2021-08-29 DIAGNOSIS — H35413 Lattice degeneration of retina, bilateral: Secondary | ICD-10-CM

## 2021-08-29 DIAGNOSIS — R809 Proteinuria, unspecified: Secondary | ICD-10-CM

## 2021-08-29 DIAGNOSIS — Z794 Long term (current) use of insulin: Secondary | ICD-10-CM

## 2021-08-29 DIAGNOSIS — E1129 Type 2 diabetes mellitus with other diabetic kidney complication: Secondary | ICD-10-CM

## 2021-08-29 NOTE — Progress Notes (Signed)
08/29/2021     CHIEF COMPLAINT Patient presents for  Chief Complaint  Patient presents with   Retina Follow Up      HISTORY OF PRESENT ILLNESS: Samantha Camacho is a 65 y.o. female who presents to the clinic today for:   HPI     Retina Follow Up   Patient presents with  Other.  In both eyes.  This started 1 year ago.  Severity is mild.  Duration of 1 year.  Since onset it is stable.        Comments   1 yr fu OU fp. Patient states vision is stable and unchanged since last visit. Denies any new floaters or FOL.       Last edited by Laurin Coder on 08/29/2021  1:19 PM.      Referring physician: Dettinger, Fransisca Kaufmann, MD [place],  [place] [id number]  HISTORICAL INFORMATION:   Selected notes from the MEDICAL RECORD NUMBER    Lab Results  Component Value Date   HGBA1C 6.1 (H) 08/24/2021     CURRENT MEDICATIONS: No current outpatient medications on file. (Ophthalmic Drugs)   No current facility-administered medications for this visit. (Ophthalmic Drugs)   Current Outpatient Medications (Other)  Medication Sig   amLODipine (NORVASC) 10 MG tablet Take 1 tablet (10 mg total) by mouth daily.   cholecalciferol (VITAMIN D) 1000 units tablet Take 1,000 Units by mouth daily.   glucose blood (ONETOUCH VERIO) test strip USE 1 STRIP TO CHECK GLUCOSE DAILY   losartan (COZAAR) 25 MG tablet Take 1 tablet (25 mg total) by mouth daily.   metFORMIN (GLUCOPHAGE) 1000 MG tablet Take 1 tablet (1,000 mg total) by mouth 2 (two) times daily with a meal.   Potassium 99 MG TABS Take 1 tablet by mouth daily.   rosuvastatin (CRESTOR) 10 MG tablet Take 1 tablet (10 mg total) by mouth at bedtime.   No current facility-administered medications for this visit. (Other)      REVIEW OF SYSTEMS:    ALLERGIES No Known Allergies  PAST MEDICAL HISTORY Past Medical History:  Diagnosis Date   Chronic kidney disease    CKD-unknown stage   Diabetes mellitus without  complication ([place])    on meds along with diet control   GERD (gastroesophageal reflux disease)    with certain foods/OTC meds PRN   Heart murmur    recently dx-no meds   Hyperlipidemia    on meds   Hypertension    on meds   Nuclear sclerotic cataract of left eye 05/10/2020   sx completed   Nuclear sclerotic cataract of right eye 05/10/2020   sx completed   Past Surgical History:  Procedure Laterality Date   ABDOMINAL HYSTERECTOMY     CATARACT EXTRACTION, BILATERAL Bilateral 2021   CESAREAN SECTION     x 2   CHOLECYSTECTOMY  2000   WISDOM TOOTH EXTRACTION      FAMILY HISTORY Family History  Problem Relation Age of Onset   Diabetes Mother    Heart disease Mother    Cancer Mother    Colon polyps Mother 76   Colon cancer Mother 104   Diabetes Sister    Diabetes Brother    Esophageal cancer Neg Hx    Stomach cancer Neg Hx    Rectal cancer Neg Hx     SOCIAL HISTORY Social History   Tobacco Use   Smoking status: Never   Smokeless tobacco: Never  Vaping Use   Vaping  Use: Never used  Substance Use Topics   Alcohol use: Not Currently   Drug use: No         OPHTHALMIC EXAM:  Base Eye Exam     Visual Acuity (ETDRS)       Right Left   Dist [place] 20/20 20/20 -1         Tonometry (Tonopen, 1:21 PM)       Right Left   Pressure 15 16         Pupils       Pupils Dark Light APD   Right PERRL 4 3 None   Left PERRL 4 3 None         Visual Fields (Counting fingers)       Left Right    Full Full         Extraocular Movement       Right Left    Full Full         Neuro/Psych     Oriented x3: Yes   Mood/Affect: Normal         Dilation     Both eyes: 1.0% Mydriacyl, 2.5% Phenylephrine @ 1:21 PM           Slit Lamp and Fundus Exam     External Exam       Right Left   External Normal Normal         Slit Lamp Exam       Right Left   Lids/Lashes Normal Normal   Conjunctiva/Sclera White and quiet White and quiet   Cornea  Clear Clear   Anterior Chamber Deep and quiet Deep and quiet   Iris Round and reactive Round and reactive   Lens Centered posterior chamber intraocular lens, 1+ Posterior capsular opacification,  Centrally in the visual axis Centered posterior chamber intraocular lens   Anterior Vitreous Normal Normal         Fundus Exam       Right Left   Posterior Vitreous Normal Normal   Disc Normal Normal   C/D Ratio 0.35 0.25   Macula Normal Normal   Vessels no DR no DR   Periphery lattice 10 - 11, no holes or tears 25D lens with scleral depression lattice 3 - 4, no holes or tears, 25D lens with scleral depression            IMAGING AND PROCEDURES  Imaging and Procedures for 08/29/21  Color Fundus Photography Optos - OU - Both Eyes       Right Eye Progression has been stable. Disc findings include normal observations. Macula : normal observations. Vessels : normal observations. Periphery : normal observations.   Left Eye Progression has been stable. Disc findings include normal observations. Macula : normal observations. Vessels : normal observations. Periphery : normal observations.   Notes OS with vitreous strands of membranes.             ASSESSMENT/PLAN:  Type 2 diabetes mellitus ([place]) The patient has diabetes without any evidence of retinopathy. The patient advised to maintain good blood glucose control, excellent blood pressure control, and favorable levels of cholesterol, low density lipoprotein, and high density lipoproteins. Follow up in 1 year was recommended. Explained that fluctuations in visual acuity , or "out of focus", may result from large variations of blood sugar control.  Lattice degeneration of retina, bilateral No holes or tears     ICD-10-CM   1. Myopic macular degeneration of both eyes  H35.30 Color Fundus  Photography Optos - OU - Both Eyes    2. Lattice degeneration of retina, bilateral  H35.413 Color Fundus Photography Optos - OU - Both Eyes     3. Type 2 diabetes mellitus with microalbuminuria, with long-term current use of insulin (HCC)  E11.29 Color Fundus Photography Optos - OU - Both Eyes   R80.9    Z79.4       1.  OU, stable overall, no signs of detectable diabetic retinopathy.  2.  With good hemoglobin A1c control patient is released for examination.  For 2 years.  3.  No holes or tear with mild area of lattice degeneration OU  Ophthalmic Meds Ordered this visit:  No orders of the defined types were placed in this encounter.      Return in about 2 years (around 08/30/2023) for DILATE OU, COLOR FP, OCT.  There are no Patient Instructions on file for this visit.   Explained the diagnoses, plan, and follow up with the patient and they expressed understanding.  Patient expressed understanding of the importance of proper follow up care.   Clent Demark Latesha Chesney M.D. Diseases & Surgery of the Retina and Vitreous [facility] [place] 08/29/21     Abbreviations: M myopia (nearsighted); A astigmatism; H hyperopia (farsighted); P presbyopia; Mrx spectacle prescription;  CTL contact lenses; OD right eye; OS left eye; OU both eyes  XT exotropia; ET esotropia; PEK punctate epithelial keratitis; PEE punctate epithelial erosions; DES dry eye syndrome; MGD meibomian gland dysfunction; ATs artificial tears; PFAT's preservative free artificial tears; [place] nuclear sclerotic cataract; PSC posterior subcapsular cataract; ERM epi-retinal membrane; PVD posterior vitreous detachment; RD retinal detachment; DM diabetes mellitus; DR diabetic retinopathy; NPDR non-proliferative diabetic retinopathy; PDR proliferative diabetic retinopathy; CSME clinically significant macular edema; DME diabetic macular edema; dbh dot blot hemorrhages; CWS cotton wool spot; POAG primary open angle glaucoma; C/D cup-to-disc ratio; HVF humphrey visual field; GVF goldmann visual field; OCT optical coherence tomography; IOP intraocular pressure; BRVO Branch retinal  vein occlusion; CRVO central retinal vein occlusion; CRAO central retinal artery occlusion; BRAO branch retinal artery occlusion; RT retinal tear; SB scleral buckle; PPV pars plana vitrectomy; VH Vitreous hemorrhage; PRP panretinal laser photocoagulation; IVK intravitreal kenalog; VMT vitreomacular traction; MH Macular hole;  NVD neovascularization of the disc; NVE neovascularization elsewhere; AREDS age related eye disease study; ARMD age related macular degeneration; POAG primary open angle glaucoma; EBMD epithelial/anterior basement membrane dystrophy; ACIOL anterior chamber intraocular lens; IOL intraocular lens; PCIOL posterior chamber intraocular lens; Phaco/IOL phacoemulsification with intraocular lens placement; [place] photorefractive keratectomy; LASIK laser assisted in situ keratomileusis; HTN hypertension; DM diabetes mellitus; COPD chronic obstructive pulmonary disease

## 2021-08-29 NOTE — Assessment & Plan Note (Signed)

## 2021-08-29 NOTE — Assessment & Plan Note (Signed)
No holes or tears 

## 2021-09-13 ENCOUNTER — Telehealth: Payer: Self-pay | Admitting: Family Medicine

## 2021-09-23 ENCOUNTER — Ambulatory Visit (INDEPENDENT_AMBULATORY_CARE_PROVIDER_SITE_OTHER): Payer: Medicare Other

## 2021-09-23 VITALS — Ht 61.0 in | Wt 202.0 lb

## 2021-09-23 DIAGNOSIS — Z0000 Encounter for general adult medical examination without abnormal findings: Secondary | ICD-10-CM | POA: Diagnosis not present

## 2021-09-23 NOTE — Progress Notes (Signed)
Subjective:   Samantha Camacho is a 65 y.o. female who presents for an Initial Medicare Annual Wellness Visit.  Virtual Visit via Telephone Note  I connected with  Samantha Camacho on 09/23/21 at  2:45 PM EST by telephone and verified that I am speaking with the correct person using two identifiers.  Location: Patient: Home Provider: [facility] Persons participating in the virtual visit: patient/Nurse Health Advisor   I discussed the limitations, risks, security and privacy concerns of performing an evaluation and management service by telephone and the availability of in person appointments. The patient expressed understanding and agreed to proceed.  Interactive audio and video telecommunications were attempted between this nurse and patient, however failed, due to patient having technical difficulties OR patient did not have access to video capability.  We continued and completed visit with audio only.  Some vital signs may be absent or patient reported.   Samantha Abreu E Skyy Nilan, LPN   Review of Systems     Cardiac Risk Factors include: advanced age (>80men, >66 women);sedentary lifestyle;obesity (BMI >30kg/m2);diabetes mellitus;dyslipidemia;hypertension     Objective:    Today's Vitals   09/23/21 1426  Weight: 202 lb (91.6 kg)  Height: 5\' 1"  (1.549 m)   Body mass index is 38.17 kg/m.  Advanced Directives 09/23/2021 11/28/2016 10/12/2016 10/12/2016  Does Patient Have a Medical Advance Directive? No No No No  Would patient like information on creating a medical advance directive? No - Patient declined - No - Patient declined No - Patient declined    Current Medications (verified) Outpatient Encounter Medications as of 09/23/2021  Medication Sig   amLODipine (NORVASC) 10 MG tablet Take 1 tablet (10 mg total) by mouth daily.   cholecalciferol (VITAMIN D) 1000 units tablet Take 1,000 Units by mouth daily.   glucose blood (ONETOUCH VERIO) test strip USE 1 STRIP TO CHECK GLUCOSE DAILY    losartan (COZAAR) 25 MG tablet Take 1 tablet (25 mg total) by mouth daily.   metFORMIN (GLUCOPHAGE) 1000 MG tablet Take 1 tablet (1,000 mg total) by mouth 2 (two) times daily with a meal.   Potassium 99 MG TABS Take 1 tablet by mouth daily.   rosuvastatin (CRESTOR) 10 MG tablet Take 1 tablet (10 mg total) by mouth at bedtime.   No facility-administered encounter medications on file as of 09/23/2021.    Allergies (verified) Patient has no known allergies.   History: Past Medical History:  Diagnosis Date   Chronic kidney disease    CKD-unknown stage   Diabetes mellitus without complication ([place])    on meds along with diet control   GERD (gastroesophageal reflux disease)    with certain foods/OTC meds PRN   Heart murmur    recently dx-no meds   Hyperlipidemia    on meds   Hypertension    on meds   Nuclear sclerotic cataract of left eye 05/10/2020   sx completed   Nuclear sclerotic cataract of right eye 05/10/2020   sx completed   Past Surgical History:  Procedure Laterality Date   ABDOMINAL HYSTERECTOMY     CATARACT EXTRACTION, BILATERAL Bilateral 2021   CESAREAN SECTION     x 2   CHOLECYSTECTOMY  2000   EYE SURGERY  06/2020   Cataract surgery   WISDOM TOOTH EXTRACTION     Family History  Problem Relation Age of Onset   Diabetes Mother    Heart disease Mother    Cancer Mother    Colon polyps Mother 56   Colon cancer Mother  19   Hypertension Mother    Diabetes Sister    Diabetes Brother    Diabetes Father    Diabetes Brother    Esophageal cancer Neg Hx    Stomach cancer Neg Hx    Rectal cancer Neg Hx    Social History   Socioeconomic History   Marital status: Married    Spouse name: Not on file   Number of children: Not on file   Years of education: Not on file   Highest education level: Not on file  Occupational History   Occupation: retired  Tobacco Use   Smoking status: Never   Smokeless tobacco: Never  Vaping Use   Vaping Use: Never used   Substance and Sexual Activity   Alcohol use: Not Currently   Drug use: No   Sexual activity: Yes    Birth control/protection: Surgical  Other Topics Concern   Not on file  Social History Narrative   Her children live in [place] Determinants of Health   Financial Resource Strain: Low Risk    Difficulty of Paying Living Expenses: Not hard at all  Food Insecurity: No Food Insecurity   Worried About Charity fundraiser in the Last Year: Never true   Arboriculturist in the Last Year: Never true  Transportation Needs: No Transportation Needs   Lack of Transportation (Medical): No   Lack of Transportation (Non-Medical): No  Physical Activity: Inactive   Days of Exercise per Week: 0 days   Minutes of Exercise per Session: 0 min  Stress: No Stress Concern Present   Feeling of Stress : Not at all  Social Connections: Moderately Isolated   Frequency of Communication with Friends and Family: More than three times a week   Frequency of Social Gatherings with Friends and Family: Once a week   Attends Religious Services: Never   Marine scientist or Organizations: No   Attends Music therapist: Never   Marital Status: Married    Tobacco Counseling Counseling given: Not Answered   Clinical Intake:  Pre-visit preparation completed: Yes  Pain : No/denies pain     BMI - recorded: 38.17 Nutritional Status: BMI > 30  Obese Nutritional Risks: None Diabetes: Yes CBG done?: No Did pt. bring in CBG monitor from home?: No  How often do you need to have someone help you when you read instructions, pamphlets, or other written materials from your doctor or pharmacy?: 1 - Never  Diabetic? Nutrition Risk Assessment:  Has the patient had any N/V/D within the last 2 months?  No  Does the patient have any non-healing wounds?  No  Has the patient had any unintentional weight loss or weight gain?  No   Diabetes:  Is the patient diabetic?  Yes  If diabetic,  was a CBG obtained today?  No  Did the patient bring in their glucometer from home?  No  How often do you monitor your CBG's? BID.   Financial Strains and Diabetes Management:  Are you having any financial strains with the device, your supplies or your medication? No .  Does the patient want to be seen by Chronic Care Management for management of their diabetes?  No  Would the patient like to be referred to a Nutritionist or for Diabetic Management?  No   Diabetic Exams:  Diabetic Eye Exam: Completed 08/26/2020. Overdue for diabetic eye exam. Pt has been advised about the importance in completing this exam. She  will make appt soon.  Diabetic Foot Exam: Completed 05/23/2021. Pt has been advised about the importance in completing this exam. Pt is scheduled for diabetic foot exam on next year.    Interpreter Needed?: No  Information entered by :: Caidin Heidenreich, LPN   Activities of Daily Living In your present state of health, do you have any difficulty performing the following activities: 09/23/2021 09/22/2021  Hearing? N N  Vision? N N  Difficulty concentrating or making decisions? N N  Walking or climbing stairs? N N  Dressing or bathing? N N  Doing errands, shopping? N N  Preparing Food and eating ? N N  Using the Toilet? N N  In the past six months, have you accidently leaked urine? N N  Do you have problems with loss of bowel control? N N  Managing your Medications? N N  Managing your Finances? N N  Housekeeping or managing your Housekeeping? N N  Some recent data might be hidden    Patient Care Team: Dettinger, Fransisca Kaufmann, MD as PCP - General (Family Medicine)  Indicate any recent Medical Services you may have received from other than [facility] providers in the past year (date may be approximate).     Assessment:   This is a routine wellness examination for [place].  Hearing/Vision screen Hearing Screening - Comments:: Denies hearing difficulties  Vision Screening -  Comments:: Denies vision difficulties - up to date with annual eye exams with Dr Zadie Rhine  Dietary issues and exercise activities discussed: Current Exercise Habits: The patient does not participate in regular exercise at present, Type of exercise: walking, Time (Minutes): 10, Frequency (Times/Week): 7, Weekly Exercise (Minutes/Week): 70, Intensity: Mild, Exercise limited by: None identified   Goals Addressed             This Visit's Progress    Exercise 150 min/wk Moderate Activity         Depression Screen PHQ 2/9 Scores 09/23/2021 08/24/2021 05/23/2021 02/28/2021 12/01/2020 03/17/2020 11/17/2019  PHQ - 2 Score 0 0 0 0 0 0 0    Fall Risk Fall Risk  09/23/2021 09/22/2021 08/24/2021 05/23/2021 02/28/2021  Falls in the past year? 0 0 0 0 0  Number falls in past yr: 0 - - - -  Injury with Fall? 0 - - - -  Risk for fall due to : No Fall Risks - - - -  Follow up Falls prevention discussed - - - -    FALL RISK PREVENTION PERTAINING TO THE HOME:  Any stairs in or around the home? Yes  If so, are there any without handrails? No  Home free of loose throw rugs in walkways, pet beds, electrical cords, etc? Yes  Adequate lighting in your home to reduce risk of falls? Yes   ASSISTIVE DEVICES UTILIZED TO PREVENT FALLS:  Life alert? No  Use of a cane, walker or w/c? No  Grab bars in the bathroom? No  Shower chair or bench in shower? No  Elevated toilet seat or a handicapped toilet? Yes   TIMED UP AND GO:  Was the test performed? No . Telephonic visit  Cognitive Function:     6CIT Screen 09/23/2021  What Year? 0 points  What month? 0 points  What time? 0 points  Count back from 20 0 points  Months in reverse 0 points  Repeat phrase 0 points  Total Score 0    Immunizations Immunization History  Administered Date(s) Administered   Fluad Quad(high Dose 65+) 08/10/2020,  08/24/2021   Influenza, High Dose Seasonal PF 07/10/2019   Influenza,inj,Quad PF,6+ Mos 08/15/2017, 07/11/2018    Influenza-Unspecified 08/22/2016   PFIZER(Purple Top)SARS-COV-2 Vaccination 01/01/2020, 01/24/2020, 08/10/2020, 03/15/2021   PNEUMOCOCCAL CONJUGATE-20 08/24/2021   Pneumococcal Polysaccharide-23 10/13/2016   Td 09/13/2016   Tdap 09/13/2016    TDAP status: Up to date  Flu Vaccine status: Up to date  Pneumococcal vaccine status: Due, Education has been provided regarding the importance of this vaccine. Advised may receive this vaccine at local pharmacy or Health Dept. Aware to provide a copy of the vaccination record if obtained from local pharmacy or Health Dept. Verbalized acceptance and understanding.  Covid-19 vaccine status: Completed vaccines  Qualifies for Shingles Vaccine? Yes   Zostavax completed Yes   Shingrix Completed?: No.    Education has been provided regarding the importance of this vaccine. Patient has been advised to call insurance company to determine out of pocket expense if they have not yet received this vaccine. Advised may also receive vaccine at local pharmacy or Health Dept. Verbalized acceptance and understanding.  Screening Tests Health Maintenance  Topic Date Due   MAMMOGRAM  12/17/2020   COVID-19 Vaccine (5 - Booster for Pfizer series) 05/10/2021   OPHTHALMOLOGY EXAM  08/26/2021   Zoster Vaccines- Shingrix (1 of 2) 11/21/2021 (Originally 04/07/2005)   HEMOGLOBIN A1C  02/21/2022   FOOT EXAM  05/23/2022   COLONOSCOPY (Pts 45-33yrs Insurance coverage will need to be confirmed)  02/16/2026   DEXA SCAN  03/04/2026   TETANUS/TDAP  09/13/2026   Pneumonia Vaccine 65+ Years old  Completed   INFLUENZA VACCINE  Completed   Hepatitis C Screening  Completed   HPV VACCINES  Aged Out    Health Maintenance  Health Maintenance Due  Topic Date Due   MAMMOGRAM  12/17/2020   COVID-19 Vaccine (5 - Booster for [place] series) 05/10/2021   OPHTHALMOLOGY EXAM  08/26/2021    Colorectal cancer screening: Type of screening: Colonoscopy. Completed 02/16/2021. Repeat  every 5 years  Mammogram status: Ordered 09/2021. Pt provided with contact info and advised to call to schedule appt.   Bone Density status: Completed 03/04/2021. Results reflect: Bone density results: NORMAL. Repeat every 5 years.  Lung Cancer Screening: (Low Dose CT Chest recommended if Age 2-80 years, 30 pack-year currently smoking OR have quit w/in 15years.) does not qualify.  Additional Screening:  Hepatitis C Screening: does qualify; Completed 12/12/2016  Vision Screening: Recommended annual ophthalmology exams for early detection of glaucoma and other disorders of the eye. Is the patient up to date with their annual eye exam?  Yes  Who is the provider or what is the name of the office in which the patient attends annual eye exams? [facility] If pt is not established with a provider, would they like to be referred to a provider to establish care? No .   Dental Screening: Recommended annual dental exams for proper oral hygiene  Community Resource Referral / Chronic Care Management: CRR required this visit?  No   CCM required this visit?  No      Plan:     I have personally reviewed and noted the following in the patients chart:   Medical and social history Use of alcohol, tobacco or illicit drugs  Current medications and supplements including opioid prescriptions. Patient is not currently taking opioid prescriptions. Functional ability and status Nutritional status Physical activity Advanced directives List of other physicians Hospitalizations, surgeries, and ER visits in previous 12 months Vitals Screenings to include cognitive, depression,  and falls Referrals and appointments  In addition, I have reviewed and discussed with patient certain preventive protocols, quality metrics, and best practice recommendations. A written personalized care plan for preventive services as well as general preventive health recommendations were provided to patient.     Sandrea Hammond,  LPN   [id number]   Nurse Notes: None

## 2021-09-23 NOTE — Patient Instructions (Signed)
Samantha Camacho , Thank you for taking time to come for your Medicare Wellness Visit. I appreciate your ongoing commitment to your health goals. Please review the following plan we discussed and let me know if I can assist you in the future.   Screening recommendations/referrals: Colonoscopy: Done 02/16/2021 - Repeat in 5 years Mammogram: Done 12/23/2019 - Repeat annually *appointment 10/2021 Bone Density: Done 03/04/2021 - Repeat in 5 years Recommended yearly ophthalmology/optometry visit for glaucoma screening and checkup Recommended yearly dental visit for hygiene and checkup  Vaccinations: Influenza vaccine: Done 08/24/2021 - Repeat annually  Pneumococcal vaccine: Done 10/13/2016 & 08/24/2021 Tdap vaccine: Done 09/13/2016 - Repeat in 10 years  Shingles vaccine: Due. - get next year   Covid-19:Done 01/01/2020, 01/24/2020, 08/10/2020, 03/15/2021, & 09/15/2021  Advanced directives: Please bring a copy of your health care power of attorney and living will to the office to be added to your chart at your convenience.   Conditions/risks identified: Aim for 30 minutes of exercise or brisk walking each day, drink 6-8 glasses of water and eat lots of fruits and vegetables.   Next appointment: Follow up in one year for your annual wellness visit    Preventive Care 65 Years and Older, Female Preventive care refers to lifestyle choices and visits with your health care provider that can promote health and wellness. What does preventive care include? A yearly physical exam. This is also called an annual well check. Dental exams once or twice a year. Routine eye exams. Ask your health care provider how often you should have your eyes checked. Personal lifestyle choices, including: Daily care of your teeth and gums. Regular physical activity. Eating a healthy diet. Avoiding tobacco and drug use. Limiting alcohol use. Practicing safe sex. Taking low-dose aspirin every day. Taking vitamin and mineral  supplements as recommended by your health care provider. What happens during an annual well check? The services and screenings done by your health care provider during your annual well check will depend on your age, overall health, lifestyle risk factors, and family history of disease. Counseling  Your health care provider may ask you questions about your: Alcohol use. Tobacco use. Drug use. Emotional well-being. Home and relationship well-being. Sexual activity. Eating habits. History of falls. Memory and ability to understand (cognition). Work and work Statistician. Reproductive health. Screening  You may have the following tests or measurements: Height, weight, and BMI. Blood pressure. Lipid and cholesterol levels. These may be checked every 5 years, or more frequently if you are over 31 years old. Skin check. Lung cancer screening. You may have this screening every year starting at age 46 if you have a 30-pack-year history of smoking and currently smoke or have quit within the past 15 years. Fecal occult blood test (FOBT) of the stool. You may have this test every year starting at age 48. Flexible sigmoidoscopy or colonoscopy. You may have a sigmoidoscopy every 5 years or a colonoscopy every 10 years starting at age 6. Hepatitis C blood test. Hepatitis B blood test. Sexually transmitted disease (STD) testing. Diabetes screening. This is done by checking your blood sugar (glucose) after you have not eaten for a while (fasting). You may have this done every 1-3 years. Bone density scan. This is done to screen for osteoporosis. You may have this done starting at age 58. Mammogram. This may be done every 1-2 years. Talk to your health care provider about how often you should have regular mammograms. Talk with your health care provider about your  test results, treatment options, and if necessary, the need for more tests. Vaccines  Your health care provider may recommend certain  vaccines, such as: Influenza vaccine. This is recommended every year. Tetanus, diphtheria, and acellular pertussis (Tdap, Td) vaccine. You may need a Td booster every 10 years. Zoster vaccine. You may need this after age 25. Pneumococcal 13-valent conjugate (PCV13) vaccine. One dose is recommended after age 62. Pneumococcal polysaccharide (PPSV23) vaccine. One dose is recommended after age 109. Talk to your health care provider about which screenings and vaccines you need and how often you need them. This information is not intended to replace advice given to you by your health care provider. Make sure you discuss any questions you have with your health care provider. Document Released: 10/22/2015 Document Revised: 06/14/2016 Document Reviewed: 07/27/2015 Elsevier Interactive Patient Education  2017 [employer] Prevention in the Home Falls can cause injuries. They can happen to people of all ages. There are many things you can do to make your home safe and to help prevent falls. What can I do on the outside of my home? Regularly fix the edges of walkways and driveways and fix any cracks. Remove anything that might make you trip as you walk through a door, such as a raised step or threshold. Trim any bushes or trees on the path to your home. Use bright outdoor lighting. Clear any walking paths of anything that might make someone trip, such as rocks or tools. Regularly check to see if handrails are loose or broken. Make sure that both sides of any steps have handrails. Any raised decks and porches should have guardrails on the edges. Have any leaves, snow, or ice cleared regularly. Use sand or salt on walking paths during winter. Clean up any spills in your garage right away. This includes oil or grease spills. What can I do in the bathroom? Use night lights. Install grab bars by the toilet and in the tub and shower. Do not use towel bars as grab bars. Use non-skid mats or decals in  the tub or shower. If you need to sit down in the shower, use a plastic, non-slip stool. Keep the floor dry. Clean up any water that spills on the floor as soon as it happens. Remove soap buildup in the tub or shower regularly. Attach bath mats securely with double-sided non-slip rug tape. Do not have throw rugs and other things on the floor that can make you trip. What can I do in the bedroom? Use night lights. Make sure that you have a light by your bed that is easy to reach. Do not use any sheets or blankets that are too big for your bed. They should not hang down onto the floor. Have a firm chair that has side arms. You can use this for support while you get dressed. Do not have throw rugs and other things on the floor that can make you trip. What can I do in the kitchen? Clean up any spills right away. Avoid walking on wet floors. Keep items that you use a lot in easy-to-reach places. If you need to reach something above you, use a strong step stool that has a grab bar. Keep electrical cords out of the way. Do not use floor polish or wax that makes floors slippery. If you must use wax, use non-skid floor wax. Do not have throw rugs and other things on the floor that can make you trip. What can I do with my  stairs? Do not leave any items on the stairs. Make sure that there are handrails on both sides of the stairs and use them. Fix handrails that are broken or loose. Make sure that handrails are as long as the stairways. Check any carpeting to make sure that it is firmly attached to the stairs. Fix any carpet that is loose or worn. Avoid having throw rugs at the top or bottom of the stairs. If you do have throw rugs, attach them to the floor with carpet tape. Make sure that you have a light switch at the top of the stairs and the bottom of the stairs. If you do not have them, ask someone to add them for you. What else can I do to help prevent falls? Wear shoes that: Do not have high  heels. Have rubber bottoms. Are comfortable and fit you well. Are closed at the toe. Do not wear sandals. If you use a stepladder: Make sure that it is fully opened. Do not climb a closed stepladder. Make sure that both sides of the stepladder are locked into place. Ask someone to hold it for you, if possible. Clearly mark and make sure that you can see: Any grab bars or handrails. First and last steps. Where the edge of each step is. Use tools that help you move around (mobility aids) if they are needed. These include: Canes. Walkers. Scooters. Crutches. Turn on the lights when you go into a dark area. Replace any light bulbs as soon as they burn out. Set up your furniture so you have a clear path. Avoid moving your furniture around. If any of your floors are uneven, fix them. If there are any pets around you, be aware of where they are. Review your medicines with your doctor. Some medicines can make you feel dizzy. This can increase your chance of falling. Ask your doctor what other things that you can do to help prevent falls. This information is not intended to replace advice given to you by your health care provider. Make sure you discuss any questions you have with your health care provider. Document Released: 07/22/2009 Document Revised: 03/02/2016 Document Reviewed: 10/30/2014 Elsevier Interactive Patient Education  2017 [employer].

## 2021-09-26 ENCOUNTER — Ambulatory Visit (HOSPITAL_COMMUNITY)
Admission: RE | Admit: 2021-09-26 | Discharge: 2021-09-26 | Disposition: A | Discharge status: Home / Self Care | Payer: Medicare Other | Source: Ambulatory Visit | Attending: Family Medicine | Admitting: Family Medicine

## 2021-09-26 DIAGNOSIS — I358 Other nonrheumatic aortic valve disorders: Secondary | ICD-10-CM | POA: Diagnosis present

## 2021-09-26 LAB — ECHOCARDIOGRAM COMPLETE
AR max vel: 2.23 cm2
AV Area VTI: 2.15 cm2
AV Area mean vel: 2.21 cm2
AV Mean grad: 9 mmHg
AV Peak grad: 17 mmHg
Ao pk vel: 2.06 m/s
Area-P 1/2: 3.31 cm2
S' Lateral: 3.2 cm

## 2021-09-26 NOTE — Progress Notes (Signed)
*  PRELIMINARY RESULTS* Echocardiogram 2D Echocardiogram has been performed.  Samantha Camacho 09/26/2021, 11:26 AM

## 2021-10-07 ENCOUNTER — Other Ambulatory Visit: Payer: Self-pay | Admitting: *Deleted

## 2021-10-07 DIAGNOSIS — I152 Hypertension secondary to endocrine disorders: Secondary | ICD-10-CM

## 2021-10-07 MED ORDER — AMLODIPINE BESYLATE 10 MG PO TABS: 10.0000 mg | ORAL_TABLET | Freq: Every day | ORAL | 1 refills | Status: DC

## 2021-10-19 ENCOUNTER — Ambulatory Visit
Admission: RE | Admit: 2021-10-19 | Discharge: 2021-10-19 | Disposition: A | Discharge status: Home / Self Care | Payer: Medicare Other | Source: Ambulatory Visit | Attending: Family Medicine | Admitting: Family Medicine

## 2021-10-19 DIAGNOSIS — Z1231 Encounter for screening mammogram for malignant neoplasm of breast: Secondary | ICD-10-CM

## 2021-11-15 ENCOUNTER — Encounter: Payer: Self-pay | Admitting: Physician Assistant

## 2021-11-15 ENCOUNTER — Other Ambulatory Visit: Payer: Self-pay

## 2021-11-15 ENCOUNTER — Ambulatory Visit (INDEPENDENT_AMBULATORY_CARE_PROVIDER_SITE_OTHER): Payer: Medicare Other | Admitting: Physician Assistant

## 2021-11-15 DIAGNOSIS — D485 Neoplasm of uncertain behavior of skin: Secondary | ICD-10-CM | POA: Diagnosis not present

## 2021-11-15 NOTE — Patient Instructions (Signed)

## 2021-11-29 ENCOUNTER — Other Ambulatory Visit: Payer: Self-pay | Admitting: Family Medicine

## 2021-11-29 DIAGNOSIS — E1129 Type 2 diabetes mellitus with other diabetic kidney complication: Secondary | ICD-10-CM

## 2021-11-29 DIAGNOSIS — E1159 Type 2 diabetes mellitus with other circulatory complications: Secondary | ICD-10-CM

## 2021-12-04 ENCOUNTER — Encounter: Payer: Self-pay | Admitting: Physician Assistant

## 2021-12-04 NOTE — Progress Notes (Signed)
° °  New Patient   Subjective  Samantha Camacho is a 66 y.o. female who presents for the following: Skin Problem (Wants skin tag removed on right cheek , lesion on left side of nose x 1 year- no itch, no bleed. No family or personal history of melanoma or non mole skin cancers. ).   The following portions of the chart were reviewed this encounter and updated as appropriate:  Tobacco   Allergies   Meds   Problems   Med Hx   Surg Hx   Fam Hx       Objective  Well appearing patient in no apparent distress; mood and affect are within normal limits.  All skin waist up examined.  Right Zygomatic Area 6 mm raised soft plaque       Left Nasal Sidewall Skin toned plaque        Assessment & Plan  Neoplasm of uncertain behavior of skin (2) Right Zygomatic Area  Skin / nail biopsy Type of biopsy: tangential   Informed consent: discussed and consent obtained   Timeout: patient name, date of birth, surgical site, and procedure verified   Procedure prep:  Patient was prepped and draped in usual sterile fashion (Non sterile) Prep type:  Chlorhexidine Anesthesia: the lesion was anesthetized in a standard fashion   Anesthetic:  1% lidocaine w/ epinephrine 1-100,000 local infiltration Instrument used: flexible razor blade   Hemostasis achieved with: aluminum chloride and electrodesiccation   Outcome: patient tolerated procedure well   Post-procedure details: sterile dressing applied and wound care instructions given   Dressing type: bandage and petrolatum    Specimen 1 - Surgical pathology Differential Diagnosis: R/O Wart - cautery after biopsy  Check Margins: No  Left Nasal Sidewall  Skin / nail biopsy Type of biopsy: tangential   Informed consent: discussed and consent obtained   Timeout: patient name, date of birth, surgical site, and procedure verified   Anesthesia: the lesion was anesthetized in a standard fashion   Anesthetic:  1% lidocaine w/ epinephrine 1-100,000 local  infiltration Instrument used: flexible razor blade   Hemostasis achieved with: aluminum chloride   Outcome: patient tolerated procedure well   Post-procedure details: sterile dressing applied and wound care instructions given   Dressing type: bandage and petrolatum    Specimen 2 - Surgical pathology Differential Diagnosis: R/O Atypia  Check Margins: yes     I, Anett Ranker, PA-C, have reviewed all documentation's for this visit.  The documentation on 12/04/21 for the exam, diagnosis, procedures and orders are all accurate and complete.

## 2022-01-15 ENCOUNTER — Other Ambulatory Visit: Payer: Self-pay | Admitting: Family Medicine

## 2022-01-18 ENCOUNTER — Telehealth: Payer: Self-pay | Admitting: Oncology

## 2022-01-18 ENCOUNTER — Other Ambulatory Visit: Payer: Self-pay

## 2022-01-18 ENCOUNTER — Emergency Department (HOSPITAL_BASED_OUTPATIENT_CLINIC_OR_DEPARTMENT_OTHER)
Admission: EM | Admit: 2022-01-18 | Discharge: 2022-01-18 | Disposition: A | Discharge status: Home / Self Care | Payer: Medicare Other | Attending: Emergency Medicine | Admitting: Emergency Medicine

## 2022-01-18 ENCOUNTER — Encounter (HOSPITAL_BASED_OUTPATIENT_CLINIC_OR_DEPARTMENT_OTHER): Payer: Self-pay | Admitting: Emergency Medicine

## 2022-01-18 ENCOUNTER — Emergency Department (HOSPITAL_BASED_OUTPATIENT_CLINIC_OR_DEPARTMENT_OTHER): Payer: Medicare Other

## 2022-01-18 ENCOUNTER — Other Ambulatory Visit: Payer: Self-pay | Admitting: *Deleted

## 2022-01-18 DIAGNOSIS — I10 Essential (primary) hypertension: Secondary | ICD-10-CM | POA: Diagnosis not present

## 2022-01-18 DIAGNOSIS — R300 Dysuria: Secondary | ICD-10-CM | POA: Diagnosis not present

## 2022-01-18 DIAGNOSIS — R319 Hematuria, unspecified: Secondary | ICD-10-CM | POA: Diagnosis not present

## 2022-01-18 DIAGNOSIS — E119 Type 2 diabetes mellitus without complications: Secondary | ICD-10-CM | POA: Insufficient documentation

## 2022-01-18 DIAGNOSIS — N2889 Other specified disorders of kidney and ureter: Secondary | ICD-10-CM

## 2022-01-18 DIAGNOSIS — R109 Unspecified abdominal pain: Secondary | ICD-10-CM | POA: Diagnosis present

## 2022-01-18 DIAGNOSIS — R8290 Unspecified abnormal findings in urine: Secondary | ICD-10-CM | POA: Insufficient documentation

## 2022-01-18 LAB — BASIC METABOLIC PANEL
Anion gap: 12 (ref 5–15)
BUN: 14 mg/dL (ref 8–23)
CO2: 21 mmol/L — ABNORMAL LOW (ref 22–32)
Calcium: 9.7 mg/dL (ref 8.9–10.3)
Chloride: 105 mmol/L (ref 98–111)
Creatinine, Ser: 0.87 mg/dL (ref 0.44–1.00)
GFR, Estimated: >60 mL/min (ref >60)
Glucose, Bld: 158 mg/dL — ABNORMAL HIGH (ref 70–99)
Potassium: 3.9 mmol/L (ref 3.5–5.1)
Sodium: 138 mmol/L (ref 135–145)

## 2022-01-18 LAB — CBC
HCT: 35.6 % — ABNORMAL LOW (ref 36.0–46.0)
Hemoglobin: 11.8 g/dL — ABNORMAL LOW (ref 12.0–15.0)
MCH: 30 pg (ref 26.0–34.0)
MCHC: 33.1 g/dL (ref 30.0–36.0)
MCV: 90.6 fL (ref 80.0–100.0)
Platelets: 175 10*3/uL (ref 150–400)
RBC: 3.93 MIL/uL (ref 3.87–5.11)
RDW: 14 % (ref 11.5–15.5)
WBC: 12.9 10*3/uL — ABNORMAL HIGH (ref 4.0–10.5)
nRBC: 0 % (ref 0.0–0.2)

## 2022-01-18 LAB — URINALYSIS, ROUTINE W REFLEX MICROSCOPIC
Bilirubin Urine: NEGATIVE
Glucose, UA: NEGATIVE mg/dL
Leukocytes,Ua: 250 — AB
Protein, ur: 20 mg/dL — AB
Specific Gravity, Urine: 1.01 (ref 1.005–1.030)
pH: 6 (ref 5.0–8.0)

## 2022-01-18 LAB — URINALYSIS, MICROSCOPIC (REFLEX): RBC / HPF: >50 RBC/hpf (ref 0–5)

## 2022-01-18 MED ORDER — ONDANSETRON HCL 4 MG/2ML IJ SOLN
4.0000 mg | Freq: Once | INTRAMUSCULAR | Status: AC
  Administered 2022-01-18: 4 mg via INTRAVENOUS
  Filled 2022-01-18: qty 2

## 2022-01-18 MED ORDER — HYDROMORPHONE HCL 1 MG/ML IJ SOLN
0.5000 mg | Freq: Once | INTRAMUSCULAR | Status: AC
  Administered 2022-01-18: 0.5 mg via INTRAVENOUS
  Filled 2022-01-18: qty 1

## 2022-01-18 MED ORDER — CEPHALEXIN 500 MG PO CAPS: 500.0000 mg | ORAL_CAPSULE | Freq: Two times a day (BID) | ORAL | 0 refills | Status: DC

## 2022-01-18 MED ORDER — HYDROCODONE-ACETAMINOPHEN 5-325 MG PO TABS: 1.0000 | ORAL_TABLET | Freq: Four times a day (QID) | ORAL | 0 refills | Status: DC | PRN

## 2022-01-18 MED ORDER — CEPHALEXIN 250 MG PO CAPS
250.0000 mg | ORAL_CAPSULE | Freq: Once | ORAL | Status: AC
  Administered 2022-01-18: 250 mg via ORAL
  Filled 2022-01-18: qty 1

## 2022-01-18 NOTE — Discharge Instructions (Signed)
Your CT scan shows an abnormal left kidney that is thought to be cancerous. You need to follow-up with Oncology and Urology.  I've emailed both of the doctors listed on this document to help speed up the follow-up process.  I've also asked that a Education officer, museum contact you to help facilitate follow-up.   ? ?Take medications as prescribed. ? ?Return for new or worsening symptoms. ?

## 2022-01-18 NOTE — ED Triage Notes (Signed)
Presents for L flank pain and blood in urine starting yesterday at 2pm. Denies N/V, fever/chills, trouble voiding.  ? ?

## 2022-01-18 NOTE — Telephone Encounter (Signed)
Scheduled appt per 4/12 referral. Pt is aware of appt date and time. Pt is aware to arrive 15 mins prior to appt time and to bring and updated insurance card. Pt is aware of appt location.   ?

## 2022-01-18 NOTE — ED Provider Notes (Signed)
?[facility] ?[facility] ?Provider Note ?MRN:  [id number]  ?Arrival date & time: 01/18/22    ? ?Chief Complaint   ?Flank Pain ?  ?History of Present Illness   ?Samantha Camacho is a 66 y.o. year-old female presents to the ED with chief complaint of flank pain that started around lunchtime yesterday.  Patient endorses associated dysuria and hematuria.  Reports pain as severe.  Has hx of KS, but never this severe.  Denies n/v.  Denies fever or chills. ? ?History provided by patient. ? ? ?Review of Systems  ?Pertinent review of systems noted in HPI.  ? ? ?Physical Exam  ? ?Vitals:  ? 01/18/22 0430 01/18/22 0432  ?BP: 135/75   ?Pulse: 86 74  ?Resp:    ?Temp:    ?SpO2:  96%  ?  ?CONSTITUTIONAL:  non toxic-appearing, NAD ?NEURO:  Alert and oriented x 3, CN 3-12 grossly intact ?EYES:  eyes equal and reactive ?ENT/NECK:  Supple, no stridor  ?CARDIO:  normal rate, appears well-perfused  ?PULM:  No respiratory distress,  ?GI/GU:  non-distended ?MSK/SPINE:  No gross deformities, no edema, moves all extremities  ?SKIN:  no rash, atraumatic ? ? ?*Additional and/or pertinent findings included in MDM below ? ?Diagnostic and Interventional Summary  ? ? EKG Interpretation ? ?Date/Time:    ?Ventricular Rate:    ?PR Interval:    ?QRS Duration:   ?QT Interval:    ?QTC Calculation:   ?R Axis:     ?Text Interpretation:   ?  ? ?  ? ?Labs Reviewed  ?URINALYSIS, ROUTINE W REFLEX MICROSCOPIC - Abnormal; Notable for the following components:  ?    Result Value  ? Color, Urine BROWN (*)   ? APPearance CLOUDY (*)   ? Hgb urine dipstick 3+ (*)   ? Ketones, ur TRACE (*)   ? Protein, ur 20 (*)   ? Nitrite 2+ (*)   ? Leukocytes,Ua 250 (*)   ? All other components within normal limits  ?BASIC METABOLIC PANEL - Abnormal; Notable for the following components:  ? CO2 21 (*)   ? Glucose, Bld 158 (*)   ? All other components within normal limits  ?CBC - Abnormal; Notable for the following components:  ? WBC 12.9 (*)   ?  Hemoglobin 11.8 (*)   ? HCT 35.6 (*)   ? All other components within normal limits  ?URINALYSIS, MICROSCOPIC (REFLEX) - Abnormal; Notable for the following components:  ? Bacteria, UA MANY (*)   ? All other components within normal limits  ?  ?CT Renal Stone Study  ?Final Result  ?  ?  ?Medications  ?HYDROmorphone (DILAUDID) injection 0.5 mg (0.5 mg Intravenous Given 01/18/22 0418)  ?ondansetron [facility]) injection 4 mg (4 mg Intravenous Given 01/18/22 0418)  ?cephALEXin (KEFLEX) capsule 250 mg (250 mg Oral Given 01/18/22 0541)  ?  ? ?Procedures  /  Critical Care ?Procedures ? ?ED Course and Medical Decision Making  ?I have reviewed the triage vital signs, the nursing notes, and pertinent available records from the EMR. ? ?Complexity of Problems Addressed: ?High Complexity: Acute illness/injury posing a threat to life or bodily function, requiring emergent diagnostic workup, evaluation, and treatment as below. ?Comorbidities affecting this illness/injury include: ?DM, HTN ?Social Determinants Affecting Care: ?No clinically significant social determinants affecting this chief complaint.. ? ? ?ED Course: ?After considering the following differential, KS, UTI, I ordered labs and CT. ?I visualized the CT, which is notable for large abnormal left kidney, no  visible stone and agree with radiologist interpretation.. ? ?Clinical Course as of 01/18/22 0555  ?Wed Jan 18, 2022  ?5790 CT scan is concerning for transitional cell carcinoma of the left kidney.  These results were reviewed and discussed with Dr. Florina Ou, who recommends discharge with close follow-up with oncology and urology.  I have sent messages to both of the on-call providers requesting close follow-up.  Regarding the patient's acute onset of pain and hematuria, I will cover her with Keflex due to abnormal urinalysis.  I had a lengthy conversation with the patient and her husband regarding her CT findings and appropriate follow-up. [RB]  ?  ?Clinical Course User  Index ?[RB] Montine Circle, PA-C  ? ? ?Consultants: ?No consultations were needed in caring for this patient. ? ?Treatment and Plan: ? ?I considered admission due to patient's initial presentation, but after considering the examination and diagnostic results, patient will not require admission and can be discharged with outpatient follow-up. ? ?Patient discussed with attending physician, Dr. Florina Ou, who recommends follow-up with Heme/Onc and Urology.  I've sent inbox messages to both service lines asking for close follow-up as well as contacted social work. ? ?Final Clinical Impressions(s) / ED Diagnoses  ? ?  ICD-10-CM   ?1. Renal mass  N28.89   ?  ?2. Flank pain  R10.9   ?  ?3. Abnormal urinalysis  R82.90   ?  ?  ?ED Discharge Orders   ? ?      Ordered  ?  cephALEXin (KEFLEX) 500 MG capsule  2 times daily       ? 01/18/22 0548  ?  HYDROcodone-acetaminophen (NORCO/VICODIN) 5-325 MG tablet  Every 6 hours PRN       ? 01/18/22 0548  ? ?  ?  ? ?  ?  ? ? ?Discharge Instructions Discussed with and Provided to Patient:  ? ? ? ?Discharge Instructions   ? ?  ?Your CT scan shows an abnormal left kidney that is thought to be cancerous. You need to follow-up with Oncology and Urology.  I've emailed both of the doctors listed on this document to help speed up the follow-up process.  I've also asked that a Education officer, museum contact you to help facilitate follow-up.   ? ?Take medications as prescribed. ? ?Return for new or worsening symptoms. ? ? ? ? ?  ?Montine Circle, PA-C ?01/18/22 3833 ? ?  ?Shanon Rosser, MD ?01/18/22 [phone] ? ?

## 2022-01-25 ENCOUNTER — Telehealth: Payer: Self-pay | Admitting: Family Medicine

## 2022-01-25 ENCOUNTER — Telehealth: Payer: Self-pay | Admitting: *Deleted

## 2022-01-25 NOTE — Telephone Encounter (Signed)
Pts spouse called stating that the hospital referred pt to see a Urologist and wants Dr Neldon Mc opinion on what the best Urologist is, that pt should go see? ?

## 2022-01-25 NOTE — Telephone Encounter (Signed)
I like [facility], I think just about anybody in their group is really good. ?

## 2022-01-25 NOTE — Telephone Encounter (Signed)
Urgent referral sent to [facility] per Dr. Alen Blew - copy of ED notes, CT report & labs from 01/18/2022.  Fax confirmation received. ?

## 2022-01-25 NOTE — Telephone Encounter (Signed)
Samantha Camacho has been made aware of [facility]'s recommendations. They will look up the group and call back if needed. ?

## 2022-02-01 ENCOUNTER — Other Ambulatory Visit: Payer: Self-pay

## 2022-02-01 ENCOUNTER — Inpatient Hospital Stay: Payer: Medicare Other | Attending: Oncology | Admitting: Oncology

## 2022-02-01 VITALS — BP 145/67 | HR 72 | Temp 97.7°F | Resp 17 | Ht 61.0 in | Wt 210.4 lb

## 2022-02-01 DIAGNOSIS — R109 Unspecified abdominal pain: Secondary | ICD-10-CM

## 2022-02-01 DIAGNOSIS — Z809 Family history of malignant neoplasm, unspecified: Secondary | ICD-10-CM

## 2022-02-01 DIAGNOSIS — N2889 Other specified disorders of kidney and ureter: Secondary | ICD-10-CM

## 2022-02-01 DIAGNOSIS — C649 Malignant neoplasm of unspecified kidney, except renal pelvis: Secondary | ICD-10-CM

## 2022-02-01 NOTE — Progress Notes (Signed)
?Reason for the request:    Kidney mass ? ?HPI: I was asked by Dr. Warrick Parisian to evaluate Ms. Samantha Camacho for the evaluation of kidney mass.  She is a 66 year old woman with history of diabetes and hyperlipidemia who presented to the emergency department on January 18, 2022 with flank pain.  She was evaluated at that time and CT scan of the abdomen and pelvis showed a 16 cm left kidney mass expanding related to tumor located throughout the renal sinus and midpole.  Left-sided retroperitoneal and perirenal lymphadenopathy up to 11 mm were also noted but appears to be reactive.  Laboratory data showed normal kidney function and leukocytosis.  Urine analysis did not show many bacteria and positive nitrate.  She was given Dilaudid injection as well as Keflex and was discharged.  Since that time, she reports feeling better at this time.  She is no longer reporting any flank pain or discomfort.  She denies any hematuria or dysuria at this time. ? ?She does not report any headaches, blurry vision, syncope or seizures. Does not report any fevers, chills or sweats.  Does not report any cough, wheezing or hemoptysis.  Does not report any chest pain, palpitation, orthopnea or leg edema.  Does not report any nausea, vomiting or abdominal pain.  Does not report any constipation or diarrhea.  Does not report any skeletal complaints.    Does not report frequency, urgency or hematuria.  Does not report any skin rashes or lesions. Does not report any heat or cold intolerance.  Does not report any lymphadenopathy or petechiae.  Does not report any anxiety or depression.  Remaining review of systems is negative.  ? ? ? ?Past Medical History:  ?Diagnosis Date  ? Chronic kidney disease   ? CKD-unknown stage  ? Diabetes mellitus without complication ([place])   ? on meds along with diet control  ? GERD (gastroesophageal reflux disease)   ? with certain foods/OTC meds PRN  ? Heart murmur   ? recently dx-no meds  ? Hyperlipidemia   ? on meds  ?  Hypertension   ? on meds  ? Nuclear sclerotic cataract of left eye 05/10/2020  ? sx completed  ? Nuclear sclerotic cataract of right eye 05/10/2020  ? sx completed  ?: ? ? ?Past Surgical History:  ?Procedure Laterality Date  ? ABDOMINAL HYSTERECTOMY    ? CATARACT EXTRACTION, BILATERAL Bilateral 2021  ? CESAREAN SECTION    ? x 2  ? CHOLECYSTECTOMY  2000  ? EYE SURGERY  06/2020  ? Cataract surgery  ? WISDOM TOOTH EXTRACTION    ?: ? ? ?Current Outpatient Medications:  ?  amLODipine (NORVASC) 10 MG tablet, Take 1 tablet (10 mg total) by mouth daily., Disp: 90 tablet, Rfl: 1 ?  cephALEXin (KEFLEX) 500 MG capsule, Take 1 capsule (500 mg total) by mouth 2 (two) times daily., Disp: 14 capsule, Rfl: 0 ?  cholecalciferol (VITAMIN D) 1000 units tablet, Take 1,000 Units by mouth daily., Disp: , Rfl:  ?  glucose blood (ONETOUCH VERIO) test strip, USE 1 STRIP TO CHECK GLUCOSE DAILY, Disp: 100 each, Rfl: 3 ?  HYDROcodone-acetaminophen (NORCO/VICODIN) 5-325 MG tablet, Take 1-2 tablets by mouth every 6 (six) hours as needed., Disp: 10 tablet, Rfl: 0 ?  losartan (COZAAR) 25 MG tablet, Take 1 tablet by mouth once daily, Disp: 90 tablet, Rfl: 0 ?  metFORMIN (GLUCOPHAGE) 1000 MG tablet, Take 1 tablet (1,000 mg total) by mouth 2 (two) times daily with a meal., Disp: 180  tablet, Rfl: 3 ?  Potassium 99 MG TABS, Take 1 tablet by mouth daily., Disp: , Rfl:  ?  rosuvastatin (CRESTOR) 10 MG tablet, TAKE 1 TABLET BY MOUTH AT BEDTIME, Disp: 90 tablet, Rfl: 0: ? ?No Known Allergies: ? ? ?Family History  ?Problem Relation Age of Onset  ? Diabetes Mother   ? Heart disease Mother   ? Cancer Mother   ? Colon polyps Mother 67  ? Colon cancer Mother 85  ? Hypertension Mother   ? Diabetes Sister   ? Diabetes Brother   ? Diabetes Father   ? Diabetes Brother   ? Esophageal cancer Neg Hx   ? Stomach cancer Neg Hx   ? Rectal cancer Neg Hx   ?: ? ? ?Social History  ? ?Socioeconomic History  ? Marital status: Married  ?  Spouse name: Not on file  ? Number of  children: Not on file  ? Years of education: Not on file  ? Highest education level: Not on file  ?Occupational History  ? Occupation: retired  ?Tobacco Use  ? Smoking status: Never  ? Smokeless tobacco: Never  ?Vaping Use  ? Vaping Use: Never used  ?Substance and Sexual Activity  ? Alcohol use: Not Currently  ? Drug use: No  ? Sexual activity: Yes  ?  Birth control/protection: Surgical  ?Other Topics Concern  ? Not on file  ?Social History Narrative  ? Her children live in [place]  ? ?Social Determinants of Health  ? ?Financial Resource Strain: Low Risk   ? Difficulty of Paying Living Expenses: Not hard at all  ?Food Insecurity: No Food Insecurity  ? Worried About Charity fundraiser in the Last Year: Never true  ? Ran Out of Food in the Last Year: Never true  ?Transportation Needs: No Transportation Needs  ? Lack of Transportation (Medical): No  ? Lack of Transportation (Non-Medical): No  ?Physical Activity: Inactive  ? Days of Exercise per Week: 0 days  ? Minutes of Exercise per Session: 0 min  ?Stress: No Stress Concern Present  ? Feeling of Stress : Not at all  ?Social Connections: Moderately Isolated  ? Frequency of Communication with Friends and Family: More than three times a week  ? Frequency of Social Gatherings with Friends and Family: Once a week  ? Attends Religious Services: Never  ? Active Member of Clubs or Organizations: No  ? Attends Archivist Meetings: Never  ? Marital Status: Married  ?Intimate Partner Violence: Not At Risk  ? Fear of Current or Ex-Partner: No  ? Emotionally Abused: No  ? Physically Abused: No  ? Sexually Abused: No  ?: ? ?Pertinent items are noted in HPI. ? ?Exam: ?Blood pressure (!) 145/67, pulse 72, temperature 97.7 ?F (36.5 ?C), temperature source Temporal, resp. rate 17, height '5\' 1"'$  (1.549 m), weight 210 lb 6.4 oz (95.4 kg), SpO2 99 %. ?ECOG is 0 ?General appearance: alert and cooperative appeared without distress. ?Head: atraumatic without any  abnormalities. ?Eyes: conjunctivae/corneas clear. PERRL.  Sclera anicteric. ?Throat: lips, mucosa, and tongue normal; without oral thrush or ulcers. ?Resp: clear to auscultation bilaterally without rhonchi, wheezes or dullness to percussion. ?Cardio: regular rate and rhythm, S1, S2 normal, no murmur, click, rub or gallop ?GI: soft, non-tender; bowel sounds normal; no masses,  no organomegaly ?Skin: Skin color, texture, turgor normal. No rashes or lesions ?Lymph nodes: Cervical, supraclavicular, and axillary nodes normal. ?Neurologic: Grossly normal without any motor, sensory or deep tendon reflexes. ?  Musculoskeletal: No joint deformity or effusion. ? ? ? ?CT Renal Stone Study ? ?Result Date: 01/18/2022 ?CLINICAL DATA:  66 year old female with flank pain. EXAM: CT ABDOMEN AND PELVIS WITHOUT CONTRAST TECHNIQUE: Multidetector CT imaging of the abdomen and pelvis was performed following the standard protocol without IV contrast. RADIATION DOSE REDUCTION: This exam was performed according to the departmental dose-optimization program which includes automated exposure control, adjustment of the mA and/or kV according to patient size and/or use of iterative reconstruction technique. COMPARISON:  Report of CT Abdomen and Pelvis 06/07/2004 (no images available). FINDINGS: Lower chest: Mild cardiomegaly but no pericardial or pleural effusion. Minor lung base atelectasis. No lung base nodule. Hepatobiliary: Negative noncontrast liver. Surgically absent gallbladder. Pancreas: Negative. Spleen: Negative. Adrenals/Urinary Tract: Adrenal glands remain normal, the left adrenal is displaced superiorly. Noncontrast right kidney appears nonobstructed and within normal limits. No right hydroureter. Markedly abnormal left kidney which is massively enlarged (16 cm craniocaudal) and appears expanded by bulky heterogeneous tumor located throughout the renal sinus and midpole. Lobulated enlargement of the kidney with poorly delineated tumor  margins on this noncontrast exam. There is some underlying enlargement of renal calices. The left renal pelvis is abnormal. There is moderate pararenal edema or inflammation. There are tortuous vascular collaterals at th

## 2022-02-02 ENCOUNTER — Ambulatory Visit (INDEPENDENT_AMBULATORY_CARE_PROVIDER_SITE_OTHER): Payer: Medicare Other | Admitting: Urology

## 2022-02-02 ENCOUNTER — Encounter: Payer: Self-pay | Admitting: Urology

## 2022-02-02 VITALS — BP 131/69 | HR 60 | Wt 210.0 lb

## 2022-02-02 DIAGNOSIS — R8290 Unspecified abnormal findings in urine: Secondary | ICD-10-CM

## 2022-02-02 DIAGNOSIS — N2889 Other specified disorders of kidney and ureter: Secondary | ICD-10-CM | POA: Diagnosis not present

## 2022-02-02 MED ORDER — CIPROFLOXACIN HCL 500 MG PO TABS: 500.0000 mg | ORAL_TABLET | Freq: Two times a day (BID) | ORAL | 0 refills | Status: AC

## 2022-02-02 NOTE — Progress Notes (Signed)
? ?Assessment: ?1. Left renal mass   ?2. Abnormal urine findings   ? ?Plan: ?I reviewed the patient's chart including documentation from her recent ER visit including lab results and imaging results. ?I personally reviewed the CT study from 01/18/2022 showing a large left renal mass. ?I discussed these findings with the patient and her husband today.  I recommended further evaluation with a MRI.  Hopefully this will provide additional information regarding origin of the mass (renal cell versus urothelial), lymphadenopathy, and possible vascular involvement.  I discussed the management of renal masses with surgical excision.  Given the size of the mass, she would likely need an open radical nephrectomy or radical nephro ureterectomy. ?CMP today ?Urine culture sent today ?Cipro 500 mg twice daily x5 days for treatment of UTI. ?Schedule for MRI abdomen with and without contrast for further evaluation of the left renal mass. ?Recommend referral to [facility] urology or [facility] in [place] for surgical management. ?Return to office after MRI ? ?I personally spent 50 minutes involved in face to face and non-face-to-face activities for this patient on the day of the visit.  Professional time spent included the following activities, in addition to those noted in the documentation: Review of patient's chart, review of lab results, review of imaging studies, extensive discussion of findings and recommendations for further evaluation and management with the patient and her husband. ? ?Chief Complaint:  ?Chief Complaint  ?Patient presents with  ? Renal mass  ? ? ?History of Present Illness: ? ?Samantha Camacho is a 66 y.o. year old female who is seen in consultation from Dettinger, Fransisca Kaufmann, MD for evaluation of left renal mass. ?He presented to the emergency room on 01/18/2022 with acute onset of left-sided flank pain.  She also had associated gross hematuria.  CT abdomen and pelvis without contrast demonstrated a massively  enlarged left kidney expanded by bulky heterogeneous tumor located throughout the renal sinus and mid pole with moderate perirenal edema/inflammation and tortuous vascular collaterals at the renal hilum.  Left-sided retroperitoneal, perirenal lymphadenopathy noted. ?Urinalysis showed >50 RBCs, 21-50 WBCs, and many bacteria.  No culture sent.  She was treated with cephalexin x7 days. ?Creatinine 0.87 ?Her flank pain has resolved.  No further gross hematuria.  No weight loss.  No abdominal pain.  She reports normal bowel movements. ?She has been seen by Dr. Alen Blew in oncology.  A PET scan was ordered for further evaluation of possible metastatic disease. ? ?She reports a prior history of gross hematuria approximately 1 year ago.  This resolved after antibiotic therapy.  Urine culture grew >100 K Enterobacter. ? ? ?Past Medical History:  ?Past Medical History:  ?Diagnosis Date  ? Chronic kidney disease   ? CKD-unknown stage  ? Diabetes mellitus without complication ([place])   ? on meds along with diet control  ? GERD (gastroesophageal reflux disease)   ? with certain foods/OTC meds PRN  ? Heart murmur   ? recently dx-no meds  ? Hyperlipidemia   ? on meds  ? Hypertension   ? on meds  ? Nuclear sclerotic cataract of left eye 05/10/2020  ? sx completed  ? Nuclear sclerotic cataract of right eye 05/10/2020  ? sx completed  ? ? ?Past Surgical History:  ?Past Surgical History:  ?Procedure Laterality Date  ? ABDOMINAL HYSTERECTOMY    ? CATARACT EXTRACTION, BILATERAL Bilateral 2021  ? CESAREAN SECTION    ? x 2  ? CHOLECYSTECTOMY  2000  ? EYE SURGERY  06/2020  ?  Cataract surgery  ? WISDOM TOOTH EXTRACTION    ? ? ?Allergies:  ?No Known Allergies ? ?Family History:  ?Family History  ?Problem Relation Age of Onset  ? Diabetes Mother   ? Heart disease Mother   ? Cancer Mother   ? Colon polyps Mother 51  ? Colon cancer Mother 51  ? Hypertension Mother   ? Diabetes Sister   ? Diabetes Brother   ? Diabetes Father   ? Diabetes Brother   ?  Esophageal cancer Neg Hx   ? Stomach cancer Neg Hx   ? Rectal cancer Neg Hx   ? ? ?Social History:  ?Social History  ? ?Tobacco Use  ? Smoking status: Never  ? Smokeless tobacco: Never  ?Vaping Use  ? Vaping Use: Never used  ?Substance Use Topics  ? Alcohol use: Not Currently  ? Drug use: No  ? ? ?Review of symptoms:  ?Constitutional:  Negative for unexplained weight loss, night sweats, fever, chills ?ENT:  Negative for nose bleeds, sinus pain, painful swallowing ?CV:  Negative for chest pain, shortness of breath, exercise intolerance, palpitations, loss of consciousness ?Resp:  Negative for cough, wheezing, shortness of breath ?GI:  Negative for nausea, vomiting, diarrhea, bloody stools ?GU:  Positives noted in HPI; otherwise negative for urinary incontinence ?Neuro:  Negative for seizures, poor balance, limb weakness, slurred speech ?Psych:  Negative for lack of energy, depression, anxiety ?Endocrine:  Negative for polydipsia, polyuria, symptoms of hypoglycemia (dizziness, hunger, sweating) ?Hematologic:  Negative for anemia, purpura, petechia, prolonged or excessive bleeding, use of anticoagulants  ?Allergic:  Negative for difficulty breathing or choking as a result of exposure to anything; no shellfish allergy; no allergic response (rash/itch) to materials, foods ? ?Physical exam: ?BP 131/69   Pulse 60   Wt 210 lb (95.3 kg)   BMI 39.68 kg/m?  ?GENERAL APPEARANCE:  Well appearing, well developed, well nourished, NAD ?HEENT: Atraumatic, Normocephalic, oropharynx clear. ?NECK: Supple without lymphadenopathy or thyromegaly. ?LUNGS: Clear to auscultation bilaterally. ?HEART: Regular Rate and Rhythm without murmurs, gallops, or rubs. ?ABDOMEN: Soft, non-tender, No Masses. ?EXTREMITIES: Moves all extremities well.  Without clubbing, cyanosis, or edema. ?NEUROLOGIC:  Alert and oriented x 3, normal gait, CN II-XII grossly intact.  ?MENTAL STATUS:  Appropriate. ?BACK:  Non-tender to palpation.  No CVAT ?SKIN:  Warm,  dry and intact.   ? ?Results: ?U/A: >30 WBCs, 3-10 RBCs, many bacteria, nitrite positive ? ?CT ABDOMEN AND PELVIS WITHOUT CONTRAST ?  ?TECHNIQUE: ?Multidetector CT imaging of the abdomen and pelvis was performed ?following the standard protocol without IV contrast. ?  ?RADIATION DOSE REDUCTION: This exam was performed according to the ?departmental dose-optimization program which includes automated ?exposure control, adjustment of the mA and/or kV according to ?patient size and/or use of iterative reconstruction technique. ?  ?COMPARISON:  Report of CT Abdomen and Pelvis 06/07/2004 (no images ?available). ?  ?FINDINGS: ?Lower chest: Mild cardiomegaly but no pericardial or pleural ?effusion. Minor lung base atelectasis. No lung base nodule. ?  ?Hepatobiliary: Negative noncontrast liver. Surgically absent ?gallbladder. ?  ?Pancreas: Negative. ?  ?Spleen: Negative. ?  ?Adrenals/Urinary Tract: Adrenal glands remain normal, the left ?adrenal is displaced superiorly. ?  ?Noncontrast right kidney appears nonobstructed and within normal ?limits. No right hydroureter. ?  ?Markedly abnormal left kidney which is massively enlarged (16 cm ?craniocaudal) and appears expanded by bulky heterogeneous tumor ?located throughout the renal sinus and midpole. Lobulated ?enlargement of the kidney with poorly delineated tumor margins on ?this noncontrast exam. There is  some underlying enlargement of renal ?calices. The left renal pelvis is abnormal. There is moderate ?pararenal edema or inflammation. There are tortuous vascular ?collaterals at the left renal hilum. ?  ?No left hydroureter is associated. Pararenal space edema tracks ?caudally to the pelvic inlet retroperitoneum. Bladder is ?unremarkable. No urinary calculus. ?  ?Stomach/Bowel: Decompressed large bowel. Moderate diverticulosis of ?the descending and sigmoid colon with no active inflammation. ?Redundant transverse colon. Normal appendix. Negative terminal ?ileum. No dilated  small bowel. Decompressed stomach. No free air or ?free fluid identified. ?  ?Vascular/Lymphatic: Vascular patency is not evaluated in the absence ?of IV contrast. Mild Aortoiliac calcified atherosclerosis.

## 2022-02-03 LAB — COMPREHENSIVE METABOLIC PANEL
ALT: 33 IU/L — ABNORMAL HIGH (ref 0–32)
AST: 31 IU/L (ref 0–40)
Albumin/Globulin Ratio: 1.5 (ref 1.2–2.2)
Albumin: 4.6 g/dL (ref 3.8–4.8)
Alkaline Phosphatase: 60 IU/L (ref 44–121)
BUN/Creatinine Ratio: 15 (ref 12–28)
BUN: 9 mg/dL (ref 8–27)
Bilirubin Total: 0.4 mg/dL (ref 0.0–1.2)
CO2: 22 mmol/L (ref 20–29)
Calcium: 9.6 mg/dL (ref 8.7–10.3)
Chloride: 105 mmol/L (ref 96–106)
Creatinine, Ser: 0.61 mg/dL (ref 0.57–1.00)
Globulin, Total: 3 g/dL (ref 1.5–4.5)
Glucose: 108 mg/dL — ABNORMAL HIGH (ref 70–99)
Potassium: 4.1 mmol/L (ref 3.5–5.2)
Sodium: 144 mmol/L (ref 134–144)
Total Protein: 7.6 g/dL (ref 6.0–8.5)
eGFR: 99 mL/min/{1.73_m2} (ref >59)

## 2022-02-03 LAB — URINALYSIS, ROUTINE W REFLEX MICROSCOPIC
Bilirubin, UA: NEGATIVE
Glucose, UA: NEGATIVE
Ketones, UA: NEGATIVE
Nitrite, UA: POSITIVE — AB
Specific Gravity, UA: 1.015 (ref 1.005–1.030)
Urobilinogen, Ur: 0.2 mg/dL (ref 0.2–1.0)
pH, UA: 5.5 (ref 5.0–7.5)

## 2022-02-03 LAB — MICROSCOPIC EXAMINATION
Renal Epithel, UA: NONE SEEN /hpf
WBC, UA: >30 /hpf — AB (ref 0–5)

## 2022-02-07 ENCOUNTER — Other Ambulatory Visit: Payer: Self-pay | Admitting: Urology

## 2022-02-07 ENCOUNTER — Telehealth: Payer: Self-pay | Admitting: Oncology

## 2022-02-07 DIAGNOSIS — N2889 Other specified disorders of kidney and ureter: Secondary | ICD-10-CM

## 2022-02-07 LAB — URINE CULTURE

## 2022-02-07 NOTE — Telephone Encounter (Signed)
Scheduled per 04/26 los, patient has been called and notified. 

## 2022-02-07 NOTE — Progress Notes (Signed)
Patient aware, voiced understanding.

## 2022-02-08 ENCOUNTER — Ambulatory Visit (HOSPITAL_COMMUNITY)
Admission: RE | Admit: 2022-02-08 | Discharge: 2022-02-08 | Disposition: A | Discharge status: Home / Self Care | Payer: Medicare Other | Source: Ambulatory Visit | Attending: Urology | Admitting: Urology

## 2022-02-08 DIAGNOSIS — N2889 Other specified disorders of kidney and ureter: Secondary | ICD-10-CM | POA: Diagnosis not present

## 2022-02-08 MED ORDER — GADOBUTROL 1 MMOL/ML IV SOLN
9.0000 mL | Freq: Once | INTRAVENOUS | Status: AC | PRN
  Administered 2022-02-08: 9 mL via INTRAVENOUS

## 2022-02-10 ENCOUNTER — Encounter (HOSPITAL_COMMUNITY)
Admission: RE | Admit: 2022-02-10 | Discharge: 2022-02-10 | Disposition: A | Discharge status: Home / Self Care | Payer: Medicare Other | Source: Ambulatory Visit | Attending: Oncology | Admitting: Oncology

## 2022-02-10 DIAGNOSIS — Z7984 Long term (current) use of oral hypoglycemic drugs: Secondary | ICD-10-CM | POA: Diagnosis not present

## 2022-02-10 DIAGNOSIS — C642 Malignant neoplasm of left kidney, except renal pelvis: Secondary | ICD-10-CM | POA: Diagnosis not present

## 2022-02-10 DIAGNOSIS — E119 Type 2 diabetes mellitus without complications: Secondary | ICD-10-CM | POA: Diagnosis not present

## 2022-02-10 DIAGNOSIS — C799 Secondary malignant neoplasm of unspecified site: Secondary | ICD-10-CM | POA: Insufficient documentation

## 2022-02-10 DIAGNOSIS — R590 Localized enlarged lymph nodes: Secondary | ICD-10-CM | POA: Diagnosis not present

## 2022-02-10 DIAGNOSIS — C649 Malignant neoplasm of unspecified kidney, except renal pelvis: Secondary | ICD-10-CM | POA: Insufficient documentation

## 2022-02-10 DIAGNOSIS — K5730 Diverticulosis of large intestine without perforation or abscess without bleeding: Secondary | ICD-10-CM | POA: Diagnosis not present

## 2022-02-10 DIAGNOSIS — N2889 Other specified disorders of kidney and ureter: Secondary | ICD-10-CM | POA: Insufficient documentation

## 2022-02-10 DIAGNOSIS — N1339 Other hydronephrosis: Secondary | ICD-10-CM | POA: Insufficient documentation

## 2022-02-10 LAB — GLUCOSE, CAPILLARY: Glucose-Capillary: 125 mg/dL — ABNORMAL HIGH (ref 70–99)

## 2022-02-10 MED ORDER — FLUDEOXYGLUCOSE F - 18 (FDG) INJECTION
10.0000 | Freq: Once | INTRAVENOUS | Status: AC
  Administered 2022-02-10: 10.13 via INTRAVENOUS

## 2022-02-20 ENCOUNTER — Telehealth: Payer: Self-pay

## 2022-02-20 NOTE — Telephone Encounter (Signed)
Patient left a voice message for Dr. Felipa Eth 02-17-2022. ? ?Dr. Lottie Rater will not be able to see pt until September. ?Made and appt with Dr. Heide Spark for Mon. 02-20-2022 ? ?Thanks, ?Helene Kelp ?

## 2022-02-21 NOTE — Telephone Encounter (Signed)
See below

## 2022-02-22 ENCOUNTER — Ambulatory Visit: Payer: Medicare Other | Admitting: Family Medicine

## 2022-02-22 ENCOUNTER — Telehealth: Payer: Self-pay | Admitting: Oncology

## 2022-02-22 NOTE — Telephone Encounter (Signed)
Rescheduled appointment per providers template. Left message with new appointment times. 

## 2022-02-24 ENCOUNTER — Other Ambulatory Visit: Payer: Self-pay | Admitting: Family Medicine

## 2022-02-24 DIAGNOSIS — E1159 Type 2 diabetes mellitus with other circulatory complications: Secondary | ICD-10-CM

## 2022-02-24 DIAGNOSIS — E1129 Type 2 diabetes mellitus with other diabetic kidney complication: Secondary | ICD-10-CM

## 2022-02-28 ENCOUNTER — Telehealth: Payer: Self-pay | Admitting: *Deleted

## 2022-02-28 ENCOUNTER — Inpatient Hospital Stay: Payer: Medicare Other | Admitting: Oncology

## 2022-02-28 NOTE — Telephone Encounter (Signed)
PC to patient, spoke with her husband Merry Proud, informed him per Dr. Alen Blew patient does not need to come in for appointment today, he will be glad to call them if they have questions.  Merry Proud states they are awaiting bx results & do not have any questions at this time.

## 2022-03-29 ENCOUNTER — Ambulatory Visit (INDEPENDENT_AMBULATORY_CARE_PROVIDER_SITE_OTHER): Payer: Medicare Other | Admitting: Family Medicine

## 2022-03-29 ENCOUNTER — Encounter: Payer: Self-pay | Admitting: Family Medicine

## 2022-03-29 VITALS — BP 133/64 | HR 64 | Temp 97.4°F | Ht 61.0 in | Wt 205.0 lb

## 2022-03-29 DIAGNOSIS — E1110 Type 2 diabetes mellitus with ketoacidosis without coma: Secondary | ICD-10-CM

## 2022-03-29 DIAGNOSIS — C642 Malignant neoplasm of left kidney, except renal pelvis: Secondary | ICD-10-CM

## 2022-03-29 DIAGNOSIS — E785 Hyperlipidemia, unspecified: Secondary | ICD-10-CM

## 2022-03-29 DIAGNOSIS — I152 Hypertension secondary to endocrine disorders: Secondary | ICD-10-CM | POA: Diagnosis not present

## 2022-03-29 DIAGNOSIS — R809 Proteinuria, unspecified: Secondary | ICD-10-CM

## 2022-03-29 DIAGNOSIS — E1169 Type 2 diabetes mellitus with other specified complication: Secondary | ICD-10-CM | POA: Diagnosis not present

## 2022-03-29 DIAGNOSIS — E1159 Type 2 diabetes mellitus with other circulatory complications: Secondary | ICD-10-CM

## 2022-03-29 DIAGNOSIS — M25562 Pain in left knee: Secondary | ICD-10-CM

## 2022-03-29 DIAGNOSIS — R300 Dysuria: Secondary | ICD-10-CM

## 2022-03-29 DIAGNOSIS — E1129 Type 2 diabetes mellitus with other diabetic kidney complication: Secondary | ICD-10-CM | POA: Diagnosis not present

## 2022-03-29 LAB — BAYER DCA HB A1C WAIVED: HB A1C (BAYER DCA - WAIVED): 6.1 % — ABNORMAL HIGH (ref 4.8–5.6)

## 2022-03-29 MED ORDER — METFORMIN HCL 1000 MG PO TABS: 1000.0000 mg | ORAL_TABLET | Freq: Two times a day (BID) | ORAL | 3 refills | Status: DC

## 2022-03-29 MED ORDER — LOSARTAN POTASSIUM 25 MG PO TABS: 25.0000 mg | ORAL_TABLET | Freq: Every day | ORAL | 3 refills | Status: DC

## 2022-03-29 MED ORDER — ONETOUCH VERIO VI STRP: ORAL_STRIP | 3 refills | Status: DC

## 2022-03-29 MED ORDER — AMLODIPINE BESYLATE 10 MG PO TABS: 10.0000 mg | ORAL_TABLET | Freq: Every day | ORAL | 3 refills | Status: DC

## 2022-03-29 MED ORDER — ROSUVASTATIN CALCIUM 10 MG PO TABS: 10.0000 mg | ORAL_TABLET | Freq: Every day | ORAL | 3 refills | Status: DC

## 2022-03-29 NOTE — Progress Notes (Signed)
BP 133/64   Pulse 64   Temp (!) 97.4 F (36.3 C)   Ht '5\' 1"'  (1.549 m)   Wt 205 lb (93 kg)   SpO2 99%   BMI 38.73 kg/m    Subjective:   Patient ID: Samantha Camacho, female    DOB: [birth year], 66 y.o.   MRN: [id number]  HPI: Bobette Leyh is a 66 y.o. female presenting on 03/29/2022 for Medical Management of Chronic Issues and Diabetes   HPI Type 2 diabetes mellitus Patient comes in today for recheck of his diabetes. Patient has been currently taking metformin. Patient is currently on an ACE inhibitor/ARB. Patient has seen an ophthalmologist this year. Patient denies any issues with their feet. The symptom started onset as an adult hypertension and hyperlipidemia ARE RELATED TO DM   Hypertension Patient is currently on losartan and amlodipine, and their blood pressure today is 133/64. Patient denies any lightheadedness or dizziness. Patient denies headaches, blurred vision, chest pains, shortness of breath, or weakness. Denies any side effects from medication and is content with current medication.   Hyperlipidemia Patient is coming in for recheck of his hyperlipidemia. The patient is currently taking Crestor. They deny any issues with myalgias or history of liver damage from it. They deny any focal numbness or weakness or chest pain.   Patient has recently been diagnosed with clear-cell renal carcinoma and is going to do a surgery to remove it and thinks that should be sufficient  Patient is having some left knee pain that hurts sometimes when she walks and pops and catches sometimes.  She says is been going on for couple months and hurts when she walks and is on her feet.  Relevant past medical, surgical, family and social history reviewed and updated as indicated. Interim medical history since our last visit reviewed. Allergies and medications reviewed and updated.  Review of Systems  Constitutional:  Negative for chills and fever.  Eyes:  Negative for visual disturbance.   Respiratory:  Negative for chest tightness and shortness of breath.   Cardiovascular:  Negative for chest pain and leg swelling.  Genitourinary:  Negative for difficulty urinating and dysuria.  Musculoskeletal:  Negative for back pain and gait problem.  Skin:  Negative for rash.  Neurological:  Negative for dizziness, light-headedness and headaches.  Psychiatric/Behavioral:  Negative for agitation, behavioral problems, dysphoric mood and sleep disturbance. The patient is not nervous/anxious.   All other systems reviewed and are negative.   Per HPI unless specifically indicated above   Allergies as of 03/29/2022   No Known Allergies      Medication List        Accurate as of March 29, 2022  4:06 PM. If you have any questions, ask your nurse or doctor.          amLODipine 10 MG tablet Commonly known as: NORVASC Take 1 tablet (10 mg total) by mouth daily.   cholecalciferol 1000 units tablet Commonly known as: VITAMIN D Take 1,000 Units by mouth daily.   HYDROcodone-acetaminophen 5-325 MG tablet Commonly known as: NORCO/VICODIN Take 1-2 tablets by mouth every 6 (six) hours as needed.   losartan 25 MG tablet Commonly known as: COZAAR Take 1 tablet (25 mg total) by mouth daily.   metFORMIN 1000 MG tablet Commonly known as: GLUCOPHAGE Take 1 tablet (1,000 mg total) by mouth 2 (two) times daily with a meal.   OneTouch Verio test strip Generic drug: glucose blood USE 1 STRIP TO CHECK GLUCOSE DAILY  Potassium 99 MG Tabs Take 1 tablet by mouth daily.   rosuvastatin 10 MG tablet Commonly known as: CRESTOR Take 1 tablet (10 mg total) by mouth at bedtime.         Objective:   BP 133/64   Pulse 64   Temp (!) 97.4 F (36.3 C)   Ht '5\' 1"'  (1.549 m)   Wt 205 lb (93 kg)   SpO2 99%   BMI 38.73 kg/m   Wt Readings from Last 3 Encounters:  03/29/22 205 lb (93 kg)  02/02/22 210 lb (95.3 kg)  02/01/22 210 lb 6.4 oz (95.4 kg)    Physical Exam Vitals and nursing  note reviewed.  Constitutional:      General: She is not in acute distress.    Appearance: She is well-developed. She is not diaphoretic.  Eyes:     Conjunctiva/sclera: Conjunctivae normal.  Cardiovascular:     Rate and Rhythm: Normal rate and regular rhythm.     Heart sounds: Normal heart sounds. No murmur heard. Pulmonary:     Effort: Pulmonary effort is normal. No respiratory distress.     Breath sounds: Normal breath sounds. No wheezing.  Musculoskeletal:        General: No swelling or tenderness. Normal range of motion.  Skin:    General: Skin is warm and dry.     Findings: No rash.  Neurological:     Mental Status: She is alert and oriented to person, place, and time.     Coordination: Coordination normal.  Psychiatric:        Behavior: Behavior normal.    Assessment & Plan:   Problem List Items Addressed This Visit       Cardiovascular and Mediastinum   Hypertension associated with diabetes ([place])   Relevant Medications   amLODipine (NORVASC) 10 MG tablet   losartan (COZAAR) 25 MG tablet   metFORMIN (GLUCOPHAGE) 1000 MG tablet   rosuvastatin (CRESTOR) 10 MG tablet   Other Relevant Orders   CBC with Differential/Platelet   CMP14+EGFR   Lipid panel   Bayer DCA Hb A1c Waived     Endocrine   Type 2 diabetes mellitus (HCC)   Relevant Medications   glucose blood (ONETOUCH VERIO) test strip   losartan (COZAAR) 25 MG tablet   metFORMIN (GLUCOPHAGE) 1000 MG tablet   rosuvastatin (CRESTOR) 10 MG tablet   Other Relevant Orders   CBC with Differential/Platelet   CMP14+EGFR   Lipid panel   Bayer DCA Hb A1c Waived   CBC with Differential/Platelet   CMP14+EGFR   Lipid panel   Bayer DCA Hb A1c Waived   Hyperlipidemia associated with type 2 diabetes mellitus (HCC) - Primary   Relevant Medications   amLODipine (NORVASC) 10 MG tablet   losartan (COZAAR) 25 MG tablet   metFORMIN (GLUCOPHAGE) 1000 MG tablet   rosuvastatin (CRESTOR) 10 MG tablet   Other Visit  Diagnoses     Acute pain of left knee       Clear cell carcinoma of left kidney (HCC)           Left knee pain is likely related to a meniscal energy.  Recommended Voltaren gel over-the-counter   Blood pressure looks good today, no changes.  Patient is going to undergo surgery soon to have her kidney removed because of carcinoma but they should be able to remove the whole cancer  No other changes Follow up plan: Return in about 3 months (around 06/29/2022), or if symptoms worsen or fail  to improve, for diabetes and hypertension.  Counseling provided for all of the vaccine components Orders Placed This Encounter  Procedures   CBC with Differential/Platelet   CMP14+EGFR   Lipid panel   Bayer DCA Hb A1c [place], MD [facility] 03/29/2022, 4:06 PM

## 2022-03-29 NOTE — Addendum Note (Signed)
Addended by: Alphonzo Dublin on: 03/29/2022 04:12 PM   Modules accepted: Orders

## 2022-03-30 LAB — CBC WITH DIFFERENTIAL/PLATELET
Basophils Absolute: 0.1 10*3/uL (ref 0.0–0.2)
Basos: 1 %
EOS (ABSOLUTE): 0.3 10*3/uL (ref 0.0–0.4)
Eos: 4 %
Hematocrit: 35.7 % (ref 34.0–46.6)
Hemoglobin: 11.5 g/dL (ref 11.1–15.9)
Immature Grans (Abs): 0 10*3/uL (ref 0.0–0.1)
Immature Granulocytes: 1 %
Lymphocytes Absolute: 2.3 10*3/uL (ref 0.7–3.1)
Lymphs: 35 %
MCH: 29.2 pg (ref 26.6–33.0)
MCHC: 32.2 g/dL (ref 31.5–35.7)
MCV: 91 fL (ref 79–97)
Monocytes Absolute: 0.5 10*3/uL (ref 0.1–0.9)
Monocytes: 8 %
Neutrophils Absolute: 3.3 10*3/uL (ref 1.4–7.0)
Neutrophils: 51 %
Platelets: 195 10*3/uL (ref 150–450)
RBC: 3.94 x10E6/uL (ref 3.77–5.28)
RDW: 13.7 % (ref 11.7–15.4)
WBC: 6.4 10*3/uL (ref 3.4–10.8)

## 2022-03-30 LAB — CMP14+EGFR
ALT: 26 IU/L (ref 0–32)
AST: 24 IU/L (ref 0–40)
Albumin/Globulin Ratio: 1.6 (ref 1.2–2.2)
Albumin: 4.6 g/dL (ref 3.8–4.8)
Alkaline Phosphatase: 55 IU/L (ref 44–121)
BUN/Creatinine Ratio: 19 (ref 12–28)
BUN: 11 mg/dL (ref 8–27)
Bilirubin Total: 0.3 mg/dL (ref 0.0–1.2)
CO2: 21 mmol/L (ref 20–29)
Calcium: 9.7 mg/dL (ref 8.7–10.3)
Chloride: 105 mmol/L (ref 96–106)
Creatinine, Ser: 0.57 mg/dL (ref 0.57–1.00)
Globulin, Total: 2.9 g/dL (ref 1.5–4.5)
Glucose: 94 mg/dL (ref 70–99)
Potassium: 4.5 mmol/L (ref 3.5–5.2)
Sodium: 144 mmol/L (ref 134–144)
Total Protein: 7.5 g/dL (ref 6.0–8.5)
eGFR: 100 mL/min/{1.73_m2} (ref >59)

## 2022-03-30 LAB — URINALYSIS
Bilirubin, UA: NEGATIVE
Glucose, UA: NEGATIVE
Ketones, UA: NEGATIVE
Leukocytes,UA: NEGATIVE
Nitrite, UA: NEGATIVE
Specific Gravity, UA: 1.015 (ref 1.005–1.030)
Urobilinogen, Ur: 0.2 mg/dL (ref 0.2–1.0)
pH, UA: 5.5 (ref 5.0–7.5)

## 2022-03-30 LAB — LIPID PANEL
Chol/HDL Ratio: 2 ratio (ref 0.0–4.4)
Cholesterol, Total: 121 mg/dL (ref 100–199)
HDL: 61 mg/dL (ref >39)
LDL Chol Calc (NIH): 46 mg/dL (ref 0–99)
Triglycerides: 70 mg/dL (ref 0–149)
VLDL Cholesterol Cal: 14 mg/dL (ref 5–40)

## 2022-04-03 LAB — URINE CULTURE

## 2022-04-05 ENCOUNTER — Other Ambulatory Visit: Payer: Self-pay | Admitting: Family Medicine

## 2022-04-05 DIAGNOSIS — N3000 Acute cystitis without hematuria: Secondary | ICD-10-CM

## 2022-04-05 MED ORDER — AMOXICILLIN 500 MG PO CAPS: 500.0000 mg | ORAL_CAPSULE | Freq: Two times a day (BID) | ORAL | 0 refills | Status: DC

## 2022-05-16 ENCOUNTER — Other Ambulatory Visit: Payer: Self-pay | Admitting: *Deleted

## 2022-05-16 NOTE — Patient Outreach (Signed)
  Care Coordination [facility] Note Transition Care Management Follow-up Telephone Call Date of discharge and from where: 05/15/22 [facility] How have you been since you were released from the hospital? Patient states she is doing well. Any questions or concerns? No  Items Reviewed: Did the pt receive and understand the discharge instructions provided? Yes  Medications obtained and verified? Yes  Other? No  Any new allergies since your discharge? No  Dietary orders reviewed? Yes Do you have support at home? Yes   Home Care and Equipment/Supplies: Were home health services ordered? no If so, what is the name of the agency? N/A   Has the agency set up a time to come to the patient's home? not applicable Were any new equipment or medical supplies ordered?  No What is the name of the medical supply agency? N/A Were you able to get the supplies/equipment? not applicable Do you have any questions related to the use of the equipment or supplies? No  Functional Questionnaire: (I = Independent and D = Dependent) ADLs: I  Bathing/Dressing- I  Meal Prep- ASSIST  Eating- I  Maintaining continence- I  Transferring/Ambulation- I  Managing Meds- I  Follow up appointments reviewed:  PCP Hospital f/u appt confirmed? No   Specialist Hospital f/u appt confirmed? Yes  Scheduled to see Dr. Lottie Rater urology, patient reports she is to have post-op appointment in 4 weeks. She is in the process of making this appointment  Are transportation arrangements needed? No  If their condition worsens, is the pt aware to call PCP or go to the Emergency Dept.? Yes Was the patient provided with contact information for the PCP's office or ED? Yes Was to pt encouraged to call back with questions or concerns? Yes  SDOH assessments and interventions completed:   Yes  Care Coordination Interventions Activated:  No   Care Coordination Interventions:   N/A     Encounter Outcome:  Pt. Visit Completed    Emelia Loron  RN, BSN [place] [phone] [email or web address]

## 2022-05-16 NOTE — Patient Outreach (Signed)
  Care Coordination [facility] Note Transition Care Management Unsuccessful Follow-up Telephone Call  Date of discharge and from where:  05/15/22 [facility]  Attempts:  1st Attempt  Reason for unsuccessful TCM follow-up call:  Left voice message.  Emelia Loron RN, BSN [place] [phone] [email or web address]

## 2022-05-19 ENCOUNTER — Telehealth: Payer: Self-pay | Admitting: Family Medicine

## 2022-05-19 NOTE — Telephone Encounter (Signed)
Spoke with patient's husband who requested the week of 05/29/22.  Appointment scheduled on 06/01/22 at 3:55 pm with Dr. Warrick Parisian.

## 2022-06-01 ENCOUNTER — Ambulatory Visit (INDEPENDENT_AMBULATORY_CARE_PROVIDER_SITE_OTHER): Payer: Medicare Other | Admitting: Family Medicine

## 2022-06-01 ENCOUNTER — Encounter: Payer: Self-pay | Admitting: Family Medicine

## 2022-06-01 VITALS — BP 123/81 | HR 76 | Temp 97.8°F | Ht 61.0 in | Wt 201.6 lb

## 2022-06-01 DIAGNOSIS — Z905 Acquired absence of kidney: Secondary | ICD-10-CM

## 2022-06-01 DIAGNOSIS — C642 Malignant neoplasm of left kidney, except renal pelvis: Secondary | ICD-10-CM | POA: Diagnosis not present

## 2022-06-01 NOTE — Progress Notes (Signed)
BP 123/81   Pulse 76   Temp 97.8 F (36.6 C)   Ht '5\' 1"'  (1.549 m)   Wt 201 lb 9.6 oz (91.4 kg)   SpO2 93%   BMI 38.09 kg/m    Subjective:   Patient ID: Samantha Camacho, female    DOB: 05-Sep-1956, 66 y.o.   MRN: [id number]  HPI: Samantha Camacho is a 66 y.o. female presenting on 06/01/2022 for Hospitalization Follow-up   HPI Kidney cancer status post removal Patient is coming in today for hospital follow-up for renal carcinoma status post removal.  Patient was admitted on 05/12/2022 and discharged on 05/15/2022.  She was admitted to [facility] and discharged from there as well.  She says they believe they got it all and is doing well.  She is still healing from her excision and still has no restrictions but feels like things are going in the right direction.  She is very content that they feel like they got the whole cancer.  Relevant past medical, surgical, family and social history reviewed and updated as indicated. Interim medical history since our last visit reviewed. Allergies and medications reviewed and updated.  Review of Systems  Constitutional:  Negative for chills and fever.  Eyes:  Negative for visual disturbance.  Respiratory:  Negative for chest tightness and shortness of breath.   Cardiovascular:  Negative for chest pain and leg swelling.  Musculoskeletal:  Positive for back pain.  Skin:  Positive for wound (Vertical midline incision and a few other small spots are healing just fine). Negative for color change and rash.  Neurological:  Negative for light-headedness and headaches.  Psychiatric/Behavioral:  Negative for agitation and behavioral problems.   All other systems reviewed and are negative.   Per HPI unless specifically indicated above   Allergies as of 06/01/2022   No Known Allergies      Medication List        Accurate as of June 01, 2022  4:28 PM. If you have any questions, ask your nurse or doctor.          STOP taking these  medications    amoxicillin 500 MG capsule Commonly known as: AMOXIL Stopped by: Worthy Rancher, MD   HYDROcodone-acetaminophen 5-325 MG tablet Commonly known as: NORCO/VICODIN Stopped by: Fransisca Kaufmann Siera Beyersdorf, MD       TAKE these medications    amLODipine 10 MG tablet Commonly known as: NORVASC Take 1 tablet (10 mg total) by mouth daily.   cholecalciferol 1000 units tablet Commonly known as: VITAMIN D Take 1,000 Units by mouth daily.   losartan 25 MG tablet Commonly known as: COZAAR Take 1 tablet (25 mg total) by mouth daily.   metFORMIN 1000 MG tablet Commonly known as: GLUCOPHAGE Take 1 tablet (1,000 mg total) by mouth 2 (two) times daily with a meal.   OneTouch Verio test strip Generic drug: glucose blood USE 1 STRIP TO CHECK GLUCOSE DAILY   Potassium 99 MG Tabs Take 1 tablet by mouth daily.   rosuvastatin 10 MG tablet Commonly known as: CRESTOR Take 1 tablet (10 mg total) by mouth at bedtime.         Objective:   BP 123/81   Pulse 76   Temp 97.8 F (36.6 C)   Ht '5\' 1"'  (1.549 m)   Wt 201 lb 9.6 oz (91.4 kg)   SpO2 93%   BMI 38.09 kg/m   Wt Readings from Last 3 Encounters:  06/01/22 201 lb 9.6 oz (  91.4 kg)  03/29/22 205 lb (93 kg)  02/02/22 210 lb (95.3 kg)    Physical Exam Vitals and nursing note reviewed.  Constitutional:      General: She is not in acute distress.    Appearance: She is well-developed. She is not diaphoretic.  Eyes:     Conjunctiva/sclera: Conjunctivae normal.  Cardiovascular:     Rate and Rhythm: Normal rate and regular rhythm.     Heart sounds: Normal heart sounds. No murmur heard. Pulmonary:     Effort: Pulmonary effort is normal. No respiratory distress.     Breath sounds: Normal breath sounds. No wheezing.  Abdominal:     General: Abdomen is flat. Bowel sounds are normal. There is no distension.     Tenderness: There is no abdominal tenderness. There is no guarding.     Comments: Vertical midline incision and  other spots appear to be healing fine, no erythema or drainage or induration or fluctuance  Musculoskeletal:        General: No tenderness. Normal range of motion.  Skin:    General: Skin is warm and dry.     Findings: No rash.  Neurological:     Mental Status: She is alert and oriented to person, place, and time.     Coordination: Coordination normal.  Psychiatric:        Behavior: Behavior normal.       Assessment & Plan:   Problem List Items Addressed This Visit   None Visit Diagnoses     Malignant neoplasm of left kidney ([place])    -  Primary   Relevant Orders   CBC with Differential/Platelet   CMP14+EGFR   Status post nephrectomy       Relevant Orders   CBC with Differential/Platelet   CMP14+EGFR       Seems to be doing well, instructed to stay hydrated and avoid any excessive amounts of Tylenol, for now we will stay on the metformin but if her kidney start bumping up we may have to stop it and consider alternatives. Follow up plan: Return if symptoms worsen or fail to improve.  Counseling provided for all of the vaccine components Orders Placed This Encounter  Procedures   CBC with Differential/Platelet   Dixie Yosmar Ryker, MD [facility] 06/01/2022, 4:28 PM

## 2022-06-02 LAB — CBC WITH DIFFERENTIAL/PLATELET
Basophils Absolute: 0.1 10*3/uL (ref 0.0–0.2)
Basos: 1 %
EOS (ABSOLUTE): 0.5 10*3/uL — ABNORMAL HIGH (ref 0.0–0.4)
Eos: 6 %
Hematocrit: 35.3 % (ref 34.0–46.6)
Hemoglobin: 11.9 g/dL (ref 11.1–15.9)
Immature Grans (Abs): 0 10*3/uL (ref 0.0–0.1)
Immature Granulocytes: 0 %
Lymphocytes Absolute: 2.4 10*3/uL (ref 0.7–3.1)
Lymphs: 28 %
MCH: 30.8 pg (ref 26.6–33.0)
MCHC: 33.7 g/dL (ref 31.5–35.7)
MCV: 92 fL (ref 79–97)
Monocytes Absolute: 0.6 10*3/uL (ref 0.1–0.9)
Monocytes: 7 %
Neutrophils Absolute: 5 10*3/uL (ref 1.4–7.0)
Neutrophils: 58 %
Platelets: 277 10*3/uL (ref 150–450)
RBC: 3.86 x10E6/uL (ref 3.77–5.28)
RDW: 13.6 % (ref 11.7–15.4)
WBC: 8.6 10*3/uL (ref 3.4–10.8)

## 2022-06-02 LAB — CMP14+EGFR
ALT: 35 IU/L — ABNORMAL HIGH (ref 0–32)
AST: 34 IU/L (ref 0–40)
Albumin/Globulin Ratio: 1.7 (ref 1.2–2.2)
Albumin: 4.7 g/dL (ref 3.9–4.9)
Alkaline Phosphatase: 68 IU/L (ref 44–121)
BUN/Creatinine Ratio: 14 (ref 12–28)
BUN: 14 mg/dL (ref 8–27)
Bilirubin Total: 0.3 mg/dL (ref 0.0–1.2)
CO2: 20 mmol/L (ref 20–29)
Calcium: 9.8 mg/dL (ref 8.7–10.3)
Chloride: 104 mmol/L (ref 96–106)
Creatinine, Ser: 0.97 mg/dL (ref 0.57–1.00)
Globulin, Total: 2.7 g/dL (ref 1.5–4.5)
Glucose: 122 mg/dL — ABNORMAL HIGH (ref 70–99)
Potassium: 4.4 mmol/L (ref 3.5–5.2)
Sodium: 143 mmol/L (ref 134–144)
Total Protein: 7.4 g/dL (ref 6.0–8.5)
eGFR: 64 mL/min/{1.73_m2} (ref >59)

## 2022-06-29 ENCOUNTER — Encounter: Payer: Self-pay | Admitting: Family Medicine

## 2022-06-29 ENCOUNTER — Ambulatory Visit (INDEPENDENT_AMBULATORY_CARE_PROVIDER_SITE_OTHER): Payer: Medicare Other | Admitting: Family Medicine

## 2022-06-29 VITALS — BP 131/86 | HR 64 | Temp 98.0°F | Ht 61.0 in | Wt 206.0 lb

## 2022-06-29 DIAGNOSIS — Z23 Encounter for immunization: Secondary | ICD-10-CM

## 2022-06-29 DIAGNOSIS — E1159 Type 2 diabetes mellitus with other circulatory complications: Secondary | ICD-10-CM

## 2022-06-29 DIAGNOSIS — E1110 Type 2 diabetes mellitus with ketoacidosis without coma: Secondary | ICD-10-CM | POA: Diagnosis not present

## 2022-06-29 DIAGNOSIS — E785 Hyperlipidemia, unspecified: Secondary | ICD-10-CM

## 2022-06-29 DIAGNOSIS — I152 Hypertension secondary to endocrine disorders: Secondary | ICD-10-CM

## 2022-06-29 DIAGNOSIS — E1169 Type 2 diabetes mellitus with other specified complication: Secondary | ICD-10-CM | POA: Diagnosis not present

## 2022-06-29 LAB — BAYER DCA HB A1C WAIVED: HB A1C (BAYER DCA - WAIVED): 5.9 % — ABNORMAL HIGH (ref 4.8–5.6)

## 2022-06-29 NOTE — Progress Notes (Signed)
BP 131/86   Pulse 64   Temp 98 F (36.7 C)   Ht '5\' 1"'  (1.549 m)   Wt 206 lb (93.4 kg)   SpO2 97%   BMI 38.92 kg/m    Subjective:   Patient ID: Samantha Camacho, female    DOB: Apr 06, 1956, 66 y.o.   MRN: [id number]  HPI: Samantha Camacho is a 66 y.o. female presenting on 06/29/2022 for Medical Management of Chronic Issues, Hyperlipidemia, and Diabetes   HPI Type 2 diabetes mellitus Patient comes in today for recheck of his diabetes. Patient has been currently taking metformin. Patient is currently on an ACE inhibitor/ARB. Patient has seen an ophthalmologist this year. Patient denies any issues with their feet. The symptom started onset as an adult hypertension and hyperlipidemia ARE RELATED TO DM   Hypertension Patient is currently on losartan and amlodipine, and their blood pressure today is 131/86. Patient denies any lightheadedness or dizziness. Patient denies headaches, blurred vision, chest pains, shortness of breath, or weakness. Denies any side effects from medication and is content with current medication.   Hyperlipidemia Patient is coming in for recheck of his hyperlipidemia. The patient is currently taking Crestor. They deny any issues with myalgias or history of liver damage from it. They deny any focal numbness or weakness or chest pain.   Relevant past medical, surgical, family and social history reviewed and updated as indicated. Interim medical history since our last visit reviewed. Allergies and medications reviewed and updated.  Review of Systems  Constitutional:  Negative for chills and fever.  Eyes:  Negative for visual disturbance.  Respiratory:  Negative for chest tightness and shortness of breath.   Cardiovascular:  Negative for chest pain and leg swelling.  Musculoskeletal:  Negative for back pain and gait problem.  Skin:  Negative for rash.  Neurological:  Negative for dizziness, light-headedness and headaches.  Psychiatric/Behavioral:  Negative for  agitation and behavioral problems.   All other systems reviewed and are negative.   Per HPI unless specifically indicated above   Allergies as of 06/29/2022   No Known Allergies      Medication List        Accurate as of June 29, 2022  2:32 PM. If you have any questions, ask your nurse or doctor.          amLODipine 10 MG tablet Commonly known as: NORVASC Take 1 tablet (10 mg total) by mouth daily.   cholecalciferol 1000 units tablet Commonly known as: VITAMIN D Take 1,000 Units by mouth daily.   losartan 25 MG tablet Commonly known as: COZAAR Take 1 tablet (25 mg total) by mouth daily.   metFORMIN 1000 MG tablet Commonly known as: GLUCOPHAGE Take 1 tablet (1,000 mg total) by mouth 2 (two) times daily with a meal.   OneTouch Verio test strip Generic drug: glucose blood USE 1 STRIP TO CHECK GLUCOSE DAILY   Potassium 99 MG Tabs Take 1 tablet by mouth daily.   rosuvastatin 10 MG tablet Commonly known as: CRESTOR Take 1 tablet (10 mg total) by mouth at bedtime.         Objective:   BP 131/86   Pulse 64   Temp 98 F (36.7 C)   Ht '5\' 1"'  (1.549 m)   Wt 206 lb (93.4 kg)   SpO2 97%   BMI 38.92 kg/m   Wt Readings from Last 3 Encounters:  06/29/22 206 lb (93.4 kg)  06/01/22 201 lb 9.6 oz (91.4 kg)  03/29/22 205 lb (  93 kg)    Physical Exam Vitals and nursing note reviewed.  Constitutional:      General: She is not in acute distress.    Appearance: She is well-developed. She is not diaphoretic.  Eyes:     Conjunctiva/sclera: Conjunctivae normal.  Cardiovascular:     Rate and Rhythm: Normal rate and regular rhythm.     Heart sounds: Normal heart sounds. No murmur heard. Pulmonary:     Effort: Pulmonary effort is normal. No respiratory distress.     Breath sounds: Normal breath sounds. No wheezing.  Musculoskeletal:        General: No tenderness. Normal range of motion.  Skin:    General: Skin is warm and dry.     Findings: No rash.   Neurological:     Mental Status: She is alert and oriented to person, place, and time.     Coordination: Coordination normal.  Psychiatric:        Behavior: Behavior normal.       Assessment & Plan:   Problem List Items Addressed This Visit       Cardiovascular and Mediastinum   Hypertension associated with diabetes ([place]) - Primary   Relevant Orders   CBC with Differential/Platelet   CMP14+EGFR   Bayer DCA Hb A1c Waived   Lipid panel     Endocrine   Type 2 diabetes mellitus ([place])   Relevant Orders   CBC with Differential/Platelet   CMP14+EGFR   Bayer DCA Hb A1c Waived   Lipid panel   Hyperlipidemia associated with type 2 diabetes mellitus ([place])   Relevant Orders   CBC with Differential/Platelet   CMP14+EGFR   Bayer DCA Hb A1c Waived   Lipid panel   Other Visit Diagnoses     Need for immunization against influenza       Relevant Orders   Flu Vaccine QUAD High Dose(Fluad) (Completed)       A1c looks good at 5.9, no changes.  Blood pressure looks good as well. Follow up plan: Return in about 3 months (around 09/28/2022), or if symptoms worsen or fail to improve, for Diabetes and hypertension and cholesterol recheck.  Counseling provided for all of the vaccine components Orders Placed This Encounter  Procedures   Flu Vaccine QUAD High Dose(Fluad)   CBC with Differential/Platelet   CMP14+EGFR   Bayer DCA Hb A1c Waived   Lipid panel    Caryl Pina, MD [facility] 06/29/2022, 2:32 PM

## 2022-06-30 LAB — CBC WITH DIFFERENTIAL/PLATELET
Basophils Absolute: 0.1 10*3/uL (ref 0.0–0.2)
Basos: 1 %
EOS (ABSOLUTE): 0.3 10*3/uL (ref 0.0–0.4)
Eos: 4 %
Hematocrit: 36.3 % (ref 34.0–46.6)
Hemoglobin: 12 g/dL (ref 11.1–15.9)
Immature Grans (Abs): 0 10*3/uL (ref 0.0–0.1)
Immature Granulocytes: 0 %
Lymphocytes Absolute: 2.5 10*3/uL (ref 0.7–3.1)
Lymphs: 31 %
MCH: 30.4 pg (ref 26.6–33.0)
MCHC: 33.1 g/dL (ref 31.5–35.7)
MCV: 92 fL (ref 79–97)
Monocytes Absolute: 0.5 10*3/uL (ref 0.1–0.9)
Monocytes: 6 %
Neutrophils Absolute: 4.7 10*3/uL (ref 1.4–7.0)
Neutrophils: 58 %
Platelets: 249 10*3/uL (ref 150–450)
RBC: 3.95 x10E6/uL (ref 3.77–5.28)
RDW: 13.4 % (ref 11.7–15.4)
WBC: 8.1 10*3/uL (ref 3.4–10.8)

## 2022-06-30 LAB — LIPID PANEL
Chol/HDL Ratio: 2.2 ratio (ref 0.0–4.4)
Cholesterol, Total: 115 mg/dL (ref 100–199)
HDL: 53 mg/dL (ref >39)
LDL Chol Calc (NIH): 45 mg/dL (ref 0–99)
Triglycerides: 89 mg/dL (ref 0–149)
VLDL Cholesterol Cal: 17 mg/dL (ref 5–40)

## 2022-06-30 LAB — CMP14+EGFR
ALT: 43 IU/L — ABNORMAL HIGH (ref 0–32)
AST: 32 IU/L (ref 0–40)
Albumin/Globulin Ratio: 1.8 (ref 1.2–2.2)
Albumin: 4.8 g/dL (ref 3.9–4.9)
Alkaline Phosphatase: 71 IU/L (ref 44–121)
BUN/Creatinine Ratio: 14 (ref 12–28)
BUN: 14 mg/dL (ref 8–27)
Bilirubin Total: 0.3 mg/dL (ref 0.0–1.2)
CO2: 25 mmol/L (ref 20–29)
Calcium: 10.1 mg/dL (ref 8.7–10.3)
Chloride: 103 mmol/L (ref 96–106)
Creatinine, Ser: 0.98 mg/dL (ref 0.57–1.00)
Globulin, Total: 2.7 g/dL (ref 1.5–4.5)
Glucose: 102 mg/dL — ABNORMAL HIGH (ref 70–99)
Potassium: 4.3 mmol/L (ref 3.5–5.2)
Sodium: 141 mmol/L (ref 134–144)
Total Protein: 7.5 g/dL (ref 6.0–8.5)
eGFR: 63 mL/min/{1.73_m2} (ref >59)

## 2022-09-29 ENCOUNTER — Ambulatory Visit (INDEPENDENT_AMBULATORY_CARE_PROVIDER_SITE_OTHER): Payer: Medicare Other | Admitting: Family Medicine

## 2022-09-29 ENCOUNTER — Encounter: Payer: Self-pay | Admitting: Family Medicine

## 2022-09-29 VITALS — BP 124/72 | HR 66 | Temp 98.0°F | Ht 61.0 in | Wt 213.2 lb

## 2022-09-29 DIAGNOSIS — E785 Hyperlipidemia, unspecified: Secondary | ICD-10-CM

## 2022-09-29 DIAGNOSIS — E1110 Type 2 diabetes mellitus with ketoacidosis without coma: Secondary | ICD-10-CM

## 2022-09-29 DIAGNOSIS — I152 Hypertension secondary to endocrine disorders: Secondary | ICD-10-CM | POA: Diagnosis not present

## 2022-09-29 DIAGNOSIS — E1159 Type 2 diabetes mellitus with other circulatory complications: Secondary | ICD-10-CM

## 2022-09-29 DIAGNOSIS — E1169 Type 2 diabetes mellitus with other specified complication: Secondary | ICD-10-CM | POA: Diagnosis not present

## 2022-09-29 LAB — BAYER DCA HB A1C WAIVED: HB A1C (BAYER DCA - WAIVED): 7.2 % — ABNORMAL HIGH (ref 4.8–5.6)

## 2022-09-29 NOTE — Progress Notes (Signed)
BP 124/72   Pulse 66   Temp 98 F (36.7 C) (Temporal)   Ht _0  (1.549 m)   Wt 213 lb 3.2 oz (96.7 kg)   SpO2 93%   BMI 40.28 kg/m    Subjective:   Patient ID: Samantha Camacho, female    DOB: [birth year], 66 y.o.   MRN: [id number]  HPI: Samantha Camacho is a 66 y.o. female presenting on 09/29/2022 for Diabetes (3 month follow up )   HPI Type 2 diabetes mellitus Patient comes in today for recheck of his diabetes. Patient has been currently taking metformin. Patient is currently on an ACE inhibitor/ARB. Patient has not seen an ophthalmologist this year. Patient denies any issues with their feet. The symptom started onset as an adult hypertension and hyperlipidemia ARE RELATED TO DM   Hypertension Patient is currently on losartan and amlodipine, and their blood pressure today is 124/70. Patient denies any lightheadedness or dizziness. Patient denies headaches, blurred vision, chest pains, shortness of breath, or weakness. Denies any side effects from medication and is content with current medication.   Hyperlipidemia Patient is coming in for recheck of his hyperlipidemia. The patient is currently taking Crestor. They deny any issues with myalgias or history of liver damage from it. They deny any focal numbness or weakness or chest pain.   Relevant past medical, surgical, family and social history reviewed and updated as indicated. Interim medical history since our last visit reviewed. Allergies and medications reviewed and updated.  Review of Systems  Constitutional:  Negative for chills and fever.  Eyes:  Negative for redness and visual disturbance.  Respiratory:  Negative for chest tightness and shortness of breath.   Cardiovascular:  Negative for chest pain and leg swelling.  Genitourinary:  Negative for difficulty urinating and dysuria.  Musculoskeletal:  Negative for back pain and gait problem.  Skin:  Negative for rash.  Neurological:  Negative for dizziness,  light-headedness and headaches.  Psychiatric/Behavioral:  Negative for agitation and behavioral problems.   All other systems reviewed and are negative.   Per HPI unless specifically indicated above   Allergies as of 09/29/2022   No Known Allergies      Medication List        Accurate as of September 29, 2022  2:17 PM. If you have any questions, ask your nurse or doctor.          amLODipine 10 MG tablet Commonly known as: NORVASC Take 1 tablet (10 mg total) by mouth daily.   cholecalciferol 1000 units tablet Commonly known as: VITAMIN D Take 1,000 Units by mouth daily.   losartan 25 MG tablet Commonly known as: COZAAR Take 1 tablet (25 mg total) by mouth daily.   metFORMIN 1000 MG tablet Commonly known as: GLUCOPHAGE Take 1 tablet (1,000 mg total) by mouth 2 (two) times daily with a meal.   OneTouch Verio test strip Generic drug: glucose blood USE 1 STRIP TO CHECK GLUCOSE DAILY   Potassium 99 MG Tabs Take 1 tablet by mouth daily.   rosuvastatin 10 MG tablet Commonly known as: CRESTOR Take 1 tablet (10 mg total) by mouth at bedtime.         Objective:   BP 124/72   Pulse 66   Temp 98 F (36.7 C) (Temporal)   Ht _1  (1.549 m)   Wt 213 lb 3.2 oz (96.7 kg)   SpO2 93%   BMI 40.28 kg/m   Wt Readings from Last 3 Encounters:  09/29/22  213 lb 3.2 oz (96.7 kg)  06/29/22 206 lb (93.4 kg)  06/01/22 201 lb 9.6 oz (91.4 kg)    Physical Exam Vitals and nursing note reviewed.  Constitutional:      General: She is not in acute distress.    Appearance: She is well-developed. She is not diaphoretic.  Eyes:     Conjunctiva/sclera: Conjunctivae normal.  Cardiovascular:     Rate and Rhythm: Normal rate and regular rhythm.     Heart sounds: Normal heart sounds. No murmur heard. Pulmonary:     Effort: Pulmonary effort is normal. No respiratory distress.     Breath sounds: Normal breath sounds. No wheezing.  Musculoskeletal:        General: No swelling or  tenderness. Normal range of motion.  Skin:    General: Skin is warm and dry.     Findings: No rash.  Neurological:     Mental Status: She is alert and oriented to person, place, and time.     Coordination: Coordination normal.  Psychiatric:        Behavior: Behavior normal.       Assessment & Plan:   Problem List Items Addressed This Visit       Cardiovascular and Mediastinum   Hypertension associated with diabetes ([place])   Relevant Orders   CMP14+EGFR   CBC with Differential/Platelet     Endocrine   Type 2 diabetes mellitus ([place]) - Primary   Relevant Orders   Bayer DCA Hb A1c Waived   Microalbumin / creatinine urine ratio   Hyperlipidemia associated with type 2 diabetes mellitus ([place])   Relevant Orders   Lipid panel    A1c slightly up at 7.2, she will focus on diet at this point.  Takes metformin and because of the holidays, may need more medicine in the future if does not improve but for now keep what she has. Follow up plan: Return in about 3 months (around 12/29/2022), or if symptoms worsen or fail to improve, for Diabetes and hypertension and cholesterol recheck.  Counseling provided for all of the vaccine components Orders Placed This Encounter  Procedures   Lipid panel   CMP14+EGFR   CBC with Differential/Platelet   Bayer DCA Hb A1c Waived   Microalbumin / creatinine urine ratio    Caryl Pina, MD [facility] 09/29/2022, 2:17 PM

## 2022-09-30 LAB — CBC WITH DIFFERENTIAL/PLATELET
Basophils Absolute: 0.1 10*3/uL (ref 0.0–0.2)
Basos: 1 %
EOS (ABSOLUTE): 0.3 10*3/uL (ref 0.0–0.4)
Eos: 3 %
Hematocrit: 35.5 % (ref 34.0–46.6)
Hemoglobin: 11.7 g/dL (ref 11.1–15.9)
Immature Grans (Abs): 0 10*3/uL (ref 0.0–0.1)
Immature Granulocytes: 0 %
Lymphocytes Absolute: 2.4 10*3/uL (ref 0.7–3.1)
Lymphs: 30 %
MCH: 30.3 pg (ref 26.6–33.0)
MCHC: 33 g/dL (ref 31.5–35.7)
MCV: 92 fL (ref 79–97)
Monocytes Absolute: 0.5 10*3/uL (ref 0.1–0.9)
Monocytes: 6 %
Neutrophils Absolute: 4.8 10*3/uL (ref 1.4–7.0)
Neutrophils: 60 %
Platelets: 207 10*3/uL (ref 150–450)
RBC: 3.86 x10E6/uL (ref 3.77–5.28)
RDW: 13.6 % (ref 11.7–15.4)
WBC: 8 10*3/uL (ref 3.4–10.8)

## 2022-09-30 LAB — CMP14+EGFR
ALT: 39 IU/L — ABNORMAL HIGH (ref 0–32)
AST: 30 IU/L (ref 0–40)
Albumin/Globulin Ratio: 1.6 (ref 1.2–2.2)
Albumin: 4.7 g/dL (ref 3.9–4.9)
Alkaline Phosphatase: 85 IU/L (ref 44–121)
BUN/Creatinine Ratio: 14 (ref 12–28)
BUN: 15 mg/dL (ref 8–27)
Bilirubin Total: 0.4 mg/dL (ref 0.0–1.2)
CO2: 22 mmol/L (ref 20–29)
Calcium: 9.8 mg/dL (ref 8.7–10.3)
Chloride: 104 mmol/L (ref 96–106)
Creatinine, Ser: 1.05 mg/dL — ABNORMAL HIGH (ref 0.57–1.00)
Globulin, Total: 2.9 g/dL (ref 1.5–4.5)
Glucose: 85 mg/dL (ref 70–99)
Potassium: 4.5 mmol/L (ref 3.5–5.2)
Sodium: 142 mmol/L (ref 134–144)
Total Protein: 7.6 g/dL (ref 6.0–8.5)
eGFR: 58 mL/min/{1.73_m2} — ABNORMAL LOW (ref >59)

## 2022-09-30 LAB — LIPID PANEL
Chol/HDL Ratio: 2.1 ratio (ref 0.0–4.4)
Cholesterol, Total: 114 mg/dL (ref 100–199)
HDL: 54 mg/dL (ref >39)
LDL Chol Calc (NIH): 43 mg/dL (ref 0–99)
Triglycerides: 84 mg/dL (ref 0–149)
VLDL Cholesterol Cal: 17 mg/dL (ref 5–40)

## 2022-11-16 ENCOUNTER — Telehealth: Payer: Self-pay | Admitting: Family Medicine

## 2022-11-16 NOTE — Telephone Encounter (Signed)
Left message for patient to call back and schedule Medicare Annual Wellness Visit (AWV) to be completed by video or phone.   Last AWV: 09/23/2021   Please schedule at anytime with [place]     Any questions, please contact me at [phone]   Thank you,   [facility] for [place] [facility] ??[phone] or ??[phone]

## 2022-12-20 ENCOUNTER — Telehealth: Payer: Self-pay | Admitting: Family Medicine

## 2022-12-20 NOTE — Telephone Encounter (Signed)
Contacted Samantha Camacho to schedule their annual wellness visit. Patient declined to schedule AWV at this time. Explained to me that her cancer doctor told her that it was up to her to complete, that it was not necessary with her seeing so many providers  Thank you,  [name],  [place] ??[phone]

## 2022-12-28 ENCOUNTER — Encounter (INDEPENDENT_AMBULATORY_CARE_PROVIDER_SITE_OTHER): Payer: Medicare Other | Admitting: Ophthalmology

## 2022-12-28 ENCOUNTER — Encounter (INDEPENDENT_AMBULATORY_CARE_PROVIDER_SITE_OTHER): Payer: Self-pay

## 2022-12-29 ENCOUNTER — Ambulatory Visit (INDEPENDENT_AMBULATORY_CARE_PROVIDER_SITE_OTHER): Payer: Medicare Other | Admitting: Family Medicine

## 2022-12-29 ENCOUNTER — Encounter: Payer: Self-pay | Admitting: Family Medicine

## 2022-12-29 VITALS — BP 134/83 | HR 58 | Ht 61.0 in | Wt 215.0 lb

## 2022-12-29 DIAGNOSIS — M1712 Unilateral primary osteoarthritis, left knee: Secondary | ICD-10-CM | POA: Diagnosis not present

## 2022-12-29 DIAGNOSIS — E1169 Type 2 diabetes mellitus with other specified complication: Secondary | ICD-10-CM | POA: Diagnosis not present

## 2022-12-29 DIAGNOSIS — I152 Hypertension secondary to endocrine disorders: Secondary | ICD-10-CM

## 2022-12-29 DIAGNOSIS — E1110 Type 2 diabetes mellitus with ketoacidosis without coma: Secondary | ICD-10-CM

## 2022-12-29 DIAGNOSIS — E1159 Type 2 diabetes mellitus with other circulatory complications: Secondary | ICD-10-CM | POA: Diagnosis not present

## 2022-12-29 DIAGNOSIS — E785 Hyperlipidemia, unspecified: Secondary | ICD-10-CM

## 2022-12-29 DIAGNOSIS — Z1231 Encounter for screening mammogram for malignant neoplasm of breast: Secondary | ICD-10-CM

## 2022-12-29 LAB — BAYER DCA HB A1C WAIVED: HB A1C (BAYER DCA - WAIVED): 6.4 % — ABNORMAL HIGH (ref 4.8–5.6)

## 2022-12-29 NOTE — Progress Notes (Signed)
BP 134/83   Pulse (!) 58   Ht 5\' 1"  (1.549 m)   Wt 97.5 kg   SpO2 96%   BMI 40.62 kg/m    Subjective:   Patient ID: Samantha Camacho, female    DOB: 04/06/1956, 67 y.o.   MRN: [id number]  HPI: Samantha Camacho is a 67 y.o. female presenting on 12/29/2022 for Medical Management of Chronic Issues, Hyperlipidemia, Hypertension, Diabetes, and Knee Pain (Left- burning pain)  Type 2 diabetes mellitus Patient comes in today for recheck of their Type 2 Diabetes Mellitus. Patient's current medications include Metformin. Patient is currently taking an ACE inhibitor/ARB. Patient has seen an ophthalmologist this year. Patient's last ophthalmology exam was yesterday. Patient does not see a podiatrist. Patient denies any issues with their feet. The symptom started onset as an adult. Hyperlipidemia and hypertension are related to Type 2 Diabetes Mellitus.    Hypertension Patient is coming in for a recheck of their hypertension. Patient is currently taking Losartan and Amlodipine. Patient's blood pressure today is 134/83. Patient has not been taking their blood pressure at home. Patient denies lightheadedness, dizziness, weakness, headaches, blurred vision, chest pains, and shortness of breath. Patient denies any side effects from medication(s) and is content with current medication(s).   Hyperlipidemia Patient is coming in for recheck of his hyperlipidemia. The patient is currently taking Rosuvastatin. They deny any issues with myalgias or history of liver damage from it. They deny any focal numbness or weakness or chest pain.   Patient is having left medial knee pain x 1 month. Patient states her knee is burning at night, with walking, and sometimes when she gets up and starts her day. Pain level is 4/10. Patient has not tried any oral medications or topical medications. Denies pain in hip, ankles, and feet. Denies fever, swelling, and erythema.   Relevant past medical, surgical, family and social history  were reviewed and updated as indicated. Interim medical history were reviewed. Allergies and medications were reviewed and updated.  Review of Systems  Constitutional:  Negative for chills and fever.  Eyes:  Negative for visual disturbance.  Respiratory:  Negative for chest tightness and shortness of breath.   Cardiovascular:  Negative for chest pain and leg swelling.  Gastrointestinal:  Negative for abdominal pain.  Musculoskeletal:  Positive for arthralgias (left knee). Negative for back pain and gait problem.  Skin:  Negative for rash and wound.  Neurological:  Negative for dizziness, weakness, light-headedness, numbness and headaches.  Psychiatric/Behavioral:  Negative for behavioral problems and confusion.   All other systems reviewed and are negative.   Per HPI unless specifically indicated above.   Allergies as of 12/29/2022   No Known Allergies      Medication List        Accurate as of December 29, 2022  2:51 PM. If you have any questions, ask your nurse or doctor.          STOP taking these medications    Potassium 99 MG Tabs Stopped by: Fransisca Kaufmann Eythan Jayne, MD       TAKE these medications    amLODipine 10 MG tablet Commonly known as: NORVASC Take 1 tablet (10 mg total) by mouth daily.   cholecalciferol 1000 units tablet Commonly known as: VITAMIN D Take 1,000 Units by mouth daily.   losartan 25 MG tablet Commonly known as: COZAAR Take 1 tablet (25 mg total) by mouth daily.   metFORMIN 1000 MG tablet Commonly known as: GLUCOPHAGE Take 1 tablet (1,000 mg  total) by mouth 2 (two) times daily with a meal.   OneTouch Verio test strip Generic drug: glucose blood USE 1 STRIP TO CHECK GLUCOSE DAILY   rosuvastatin 10 MG tablet Commonly known as: CRESTOR Take 1 tablet (10 mg total) by mouth at bedtime.         Objective:   BP 134/83   Pulse (!) 58   Ht 5\' 1"  (1.549 m)   Wt 97.5 kg   SpO2 96%   BMI 40.62 kg/m   Wt Readings from Last 3  Encounters:  12/29/22 97.5 kg  09/29/22 96.7 kg  06/29/22 93.4 kg    Physical Exam Vitals reviewed.  Constitutional:      Appearance: Normal appearance.  HENT:     Head: Normocephalic and atraumatic.  Eyes:     General: No scleral icterus.    Conjunctiva/sclera: Conjunctivae normal.  Neck:     Thyroid: No thyromegaly.  Cardiovascular:     Rate and Rhythm: Normal rate and regular rhythm.     Heart sounds: Normal heart sounds.  Pulmonary:     Effort: Pulmonary effort is normal.     Breath sounds: Normal breath sounds.  Abdominal:     Tenderness: There is no right CVA tenderness or left CVA tenderness.  Musculoskeletal:     Cervical back: Neck supple. No tenderness.     Right knee: Normal.     Left knee: No swelling, deformity, effusion or erythema. Tenderness present over the medial joint line. No LCL laxity, MCL laxity, ACL laxity or PCL laxity.    Instability Tests: Anterior drawer test negative. Posterior drawer test negative.     Right lower leg: Normal. No edema.     Left lower leg: Normal. No edema.     Right foot: Normal.     Left foot: Normal.  Lymphadenopathy:     Cervical: No cervical adenopathy.  Skin:    General: Skin is warm and dry.  Neurological:     Mental Status: She is alert and oriented to person, place, and time.  Psychiatric:        Mood and Affect: Mood normal.        Behavior: Behavior normal.    Assessment & Plan:   Problem List Items Addressed This Visit       Cardiovascular and Mediastinum   Hypertension associated with diabetes     Endocrine   Type 2 diabetes mellitus - Primary   Relevant Orders   Bayer DCA Hb A1c Waived (Completed)   Microalbumin / creatinine urine ratio (Completed)   MM 3D SCREENING MAMMOGRAM BILATERAL BREAST   Hyperlipidemia associated with type 2 diabetes mellitus   Other Visit Diagnoses     Primary osteoarthritis of left knee       Encounter for screening mammogram for malignant neoplasm of breast        Relevant Orders   MM 3D SCREENING MAMMOGRAM BILATERAL BREAST      Patient encouraged to purchase OTC Voltaren gel and Tylenol Arthritis for left medial knee pain.    A1c is 6.4 today. Continue all other medications.  Follow up plan: Return in about 3 months (around 03/31/2023), or if symptoms worsen or fail to improve, for DM recheck.  Counseling provided for all of the vaccine components Orders Placed This Encounter  Procedures   Bayer [facility] Hb A1c [address] PA-S2 [place] 12/29/2022, 2:51 PM  Patient seen and examined with PA student, agree with assessment and  plan above.  No major changes. Vonna Kotyk [facility] 12/29/2022, 2:51 PM

## 2022-12-31 LAB — MICROALBUMIN / CREATININE URINE RATIO
Creatinine, Urine: 70.8 mg/dL
Microalb/Creat Ratio: 80 mg/g creat — ABNORMAL HIGH (ref 0–29)
Microalbumin, Urine: 56.8 ug/mL

## 2023-01-15 ENCOUNTER — Ambulatory Visit
Admission: RE | Admit: 2023-01-15 | Discharge: 2023-01-15 | Disposition: A | Discharge status: Home / Self Care | Payer: Medicare Other | Source: Ambulatory Visit

## 2023-01-15 DIAGNOSIS — Z1231 Encounter for screening mammogram for malignant neoplasm of breast: Secondary | ICD-10-CM

## 2023-01-15 DIAGNOSIS — E1110 Type 2 diabetes mellitus with ketoacidosis without coma: Secondary | ICD-10-CM

## 2023-04-04 ENCOUNTER — Encounter: Payer: Self-pay | Admitting: Family Medicine

## 2023-04-04 ENCOUNTER — Ambulatory Visit (INDEPENDENT_AMBULATORY_CARE_PROVIDER_SITE_OTHER): Payer: Medicare Other | Admitting: Family Medicine

## 2023-04-04 VITALS — BP 125/75 | HR 61 | Ht 61.0 in | Wt 214.0 lb

## 2023-04-04 DIAGNOSIS — E1110 Type 2 diabetes mellitus with ketoacidosis without coma: Secondary | ICD-10-CM | POA: Diagnosis not present

## 2023-04-04 DIAGNOSIS — E785 Hyperlipidemia, unspecified: Secondary | ICD-10-CM

## 2023-04-04 DIAGNOSIS — E1169 Type 2 diabetes mellitus with other specified complication: Secondary | ICD-10-CM

## 2023-04-04 DIAGNOSIS — E1129 Type 2 diabetes mellitus with other diabetic kidney complication: Secondary | ICD-10-CM | POA: Diagnosis not present

## 2023-04-04 DIAGNOSIS — E1159 Type 2 diabetes mellitus with other circulatory complications: Secondary | ICD-10-CM | POA: Diagnosis not present

## 2023-04-04 DIAGNOSIS — I152 Hypertension secondary to endocrine disorders: Secondary | ICD-10-CM

## 2023-04-04 DIAGNOSIS — R809 Proteinuria, unspecified: Secondary | ICD-10-CM

## 2023-04-04 LAB — BAYER DCA HB A1C WAIVED: HB A1C (BAYER DCA - WAIVED): 6.4 % — ABNORMAL HIGH (ref 4.8–5.6)

## 2023-04-04 MED ORDER — METFORMIN HCL 1000 MG PO TABS: 1000.0000 mg | ORAL_TABLET | Freq: Two times a day (BID) | ORAL | 3 refills | Status: DC

## 2023-04-04 MED ORDER — AMLODIPINE BESYLATE 10 MG PO TABS: 10.0000 mg | ORAL_TABLET | Freq: Every day | ORAL | 3 refills | Status: DC

## 2023-04-04 MED ORDER — ROSUVASTATIN CALCIUM 10 MG PO TABS: 10.0000 mg | ORAL_TABLET | Freq: Every day | ORAL | 3 refills | Status: DC

## 2023-04-04 MED ORDER — LOSARTAN POTASSIUM 25 MG PO TABS: 25.0000 mg | ORAL_TABLET | Freq: Every day | ORAL | 3 refills | Status: DC

## 2023-04-04 MED ORDER — ONETOUCH VERIO VI STRP: ORAL_STRIP | 3 refills | Status: DC

## 2023-04-04 NOTE — Progress Notes (Signed)
BP 125/75   Pulse 61   Ht 5\' 1"  (1.549 m)   Wt 214 lb (97.1 kg)   SpO2 96%   BMI 40.43 kg/m    Subjective:   Patient ID: Samantha Camacho, female    DOB: 1956-06-05, 67 y.o.   MRN: [id number]  HPI: Samantha Camacho is a 67 y.o. female presenting on 04/04/2023 for Medical Management of Chronic Issues, Hyperlipidemia, Diabetes, and Hypertension   HPI Type 2 diabetes mellitus Patient comes in today for recheck of his diabetes. Patient has been currently taking metformin. Patient is currently on an ACE inhibitor/ARB. Patient has seen an ophthalmologist this year. Patient denies any new issues with their feet. The symptom started onset as an adult hypertension and hyperlipidemia ARE RELATED TO DM   Hypertension Patient is currently on amlodipine and losartan, and their blood pressure today is 125/75. Patient denies any lightheadedness or dizziness. Patient denies headaches, blurred vision, chest pains, shortness of breath, or weakness. Denies any side effects from medication and is content with current medication.   Hyperlipidemia Patient is coming in for recheck of his hyperlipidemia. The patient is currently taking Crestor. They deny any issues with myalgias or history of liver damage from it. They deny any focal numbness or weakness or chest pain.   Relevant past medical, surgical, family and social history reviewed and updated as indicated. Interim medical history since our last visit reviewed. Allergies and medications reviewed and updated.  Review of Systems  Constitutional:  Negative for chills and fever.  HENT:  Negative for congestion, ear discharge, ear pain, postnasal drip, rhinorrhea, sinus pressure, sneezing and sore throat.   Eyes:  Negative for pain, redness and visual disturbance.  Respiratory:  Negative for chest tightness and shortness of breath.   Cardiovascular:  Negative for chest pain and leg swelling.  Genitourinary:  Negative for difficulty urinating and dysuria.   Musculoskeletal:  Negative for back pain and gait problem.  Skin:  Negative for rash.  Neurological:  Negative for light-headedness and headaches.  Psychiatric/Behavioral:  Negative for agitation and behavioral problems.   All other systems reviewed and are negative.   Per HPI unless specifically indicated above   Allergies as of 04/04/2023   No Known Allergies      Medication List        Accurate as of April 04, 2023  1:46 PM. If you have any questions, ask your nurse or doctor.          amLODipine 10 MG tablet Commonly known as: NORVASC Take 1 tablet (10 mg total) by mouth daily.   cholecalciferol 1000 units tablet Commonly known as: VITAMIN D Take 1,000 Units by mouth daily.   losartan 25 MG tablet Commonly known as: COZAAR Take 1 tablet (25 mg total) by mouth daily.   metFORMIN 1000 MG tablet Commonly known as: GLUCOPHAGE Take 1 tablet (1,000 mg total) by mouth 2 (two) times daily with a meal.   OneTouch Verio test strip Generic drug: glucose blood USE 1 STRIP TO CHECK GLUCOSE DAILY   rosuvastatin 10 MG tablet Commonly known as: CRESTOR Take 1 tablet (10 mg total) by mouth at bedtime.         Objective:   BP 125/75   Pulse 61   Ht 5\' 1"  (1.549 m)   Wt 214 lb (97.1 kg)   SpO2 96%   BMI 40.43 kg/m   Wt Readings from Last 3 Encounters:  04/04/23 214 lb (97.1 kg)  12/29/22 215 lb (97.5  kg)  09/29/22 213 lb 3.2 oz (96.7 kg)    Physical Exam Vitals and nursing note reviewed.  Constitutional:      General: She is not in acute distress.    Appearance: She is well-developed. She is not diaphoretic.  Eyes:     Conjunctiva/sclera: Conjunctivae normal.  Cardiovascular:     Rate and Rhythm: Normal rate and regular rhythm.     Heart sounds: Normal heart sounds. No murmur heard. Pulmonary:     Effort: Pulmonary effort is normal. No respiratory distress.     Breath sounds: Normal breath sounds. No wheezing.  Musculoskeletal:        General: No  swelling or tenderness. Normal range of motion.  Skin:    General: Skin is warm and dry.     Findings: No rash.  Neurological:     Mental Status: She is alert and oriented to person, place, and time.     Coordination: Coordination normal.  Psychiatric:        Behavior: Behavior normal.       Assessment & Plan:   Problem List Items Addressed This Visit       Cardiovascular and Mediastinum   Hypertension associated with diabetes ([facility])   Relevant Medications   amLODipine (NORVASC) 10 MG tablet   losartan (COZAAR) 25 MG tablet   metFORMIN (GLUCOPHAGE) 1000 MG tablet   rosuvastatin (CRESTOR) 10 MG tablet   Other Relevant Orders   CBC with Differential/Platelet   CMP14+EGFR   Lipid panel   Bayer DCA Hb A1c Waived     Endocrine   Type 2 diabetes mellitus ([facility]) - Primary   Relevant Medications   glucose blood (ONETOUCH VERIO) test strip   losartan (COZAAR) 25 MG tablet   metFORMIN (GLUCOPHAGE) 1000 MG tablet   rosuvastatin (CRESTOR) 10 MG tablet   Other Relevant Orders   CBC with Differential/Platelet   CMP14+EGFR   Lipid panel   Bayer DCA Hb A1c Waived   Hyperlipidemia associated with type 2 diabetes mellitus ([facility])   Relevant Medications   amLODipine (NORVASC) 10 MG tablet   losartan (COZAAR) 25 MG tablet   metFORMIN (GLUCOPHAGE) 1000 MG tablet   rosuvastatin (CRESTOR) 10 MG tablet   Other Relevant Orders   CBC with Differential/Platelet   CMP14+EGFR   Lipid panel   Bayer DCA Hb A1c Waived   Other Visit Diagnoses     Type 2 diabetes mellitus with microalbuminuria, without long-term current use of insulin (HCC)       Relevant Medications   losartan (COZAAR) 25 MG tablet   metFORMIN (GLUCOPHAGE) 1000 MG tablet   rosuvastatin (CRESTOR) 10 MG tablet       Blood pressure looks good, A1c is 6.4, looks good, continue with current medicine and diet  Follow up plan: Return in about 3 months (around 07/05/2023), or if symptoms worsen or fail to improve, for Diabetes  recheck.  Counseling provided for all of the vaccine components Orders Placed This Encounter  Procedures   CBC with Differential/Platelet   CMP14+EGFR   Lipid panel   Bayer DCA Hb A1c Waived    Arville Care, MD [facility] 04/04/2023, 1:46 PM

## 2023-04-05 LAB — CMP14+EGFR
ALT: 51 IU/L — ABNORMAL HIGH (ref 0–32)
AST: 47 IU/L — ABNORMAL HIGH (ref 0–40)
Albumin: 4.6 g/dL (ref 3.9–4.9)
Alkaline Phosphatase: 86 IU/L (ref 44–121)
BUN/Creatinine Ratio: 21 (ref 12–28)
BUN: 20 mg/dL (ref 8–27)
Bilirubin Total: 0.4 mg/dL (ref 0.0–1.2)
CO2: 20 mmol/L (ref 20–29)
Calcium: 9.6 mg/dL (ref 8.7–10.3)
Chloride: 106 mmol/L (ref 96–106)
Creatinine, Ser: 0.96 mg/dL (ref 0.57–1.00)
Globulin, Total: 2.8 g/dL (ref 1.5–4.5)
Glucose: 104 mg/dL — ABNORMAL HIGH (ref 70–99)
Potassium: 4.3 mmol/L (ref 3.5–5.2)
Sodium: 143 mmol/L (ref 134–144)
Total Protein: 7.4 g/dL (ref 6.0–8.5)
eGFR: 65 mL/min/{1.73_m2} (ref >59)

## 2023-04-05 LAB — CBC WITH DIFFERENTIAL/PLATELET
Basophils Absolute: 0.1 10*3/uL (ref 0.0–0.2)
Basos: 1 %
EOS (ABSOLUTE): 0.4 10*3/uL (ref 0.0–0.4)
Eos: 5 %
Hematocrit: 36.3 % (ref 34.0–46.6)
Hemoglobin: 11.6 g/dL (ref 11.1–15.9)
Immature Grans (Abs): 0 10*3/uL (ref 0.0–0.1)
Immature Granulocytes: 0 %
Lymphocytes Absolute: 2.1 10*3/uL (ref 0.7–3.1)
Lymphs: 30 %
MCH: 29.5 pg (ref 26.6–33.0)
MCHC: 32 g/dL (ref 31.5–35.7)
MCV: 92 fL (ref 79–97)
Monocytes Absolute: 0.5 10*3/uL (ref 0.1–0.9)
Monocytes: 7 %
Neutrophils Absolute: 3.9 10*3/uL (ref 1.4–7.0)
Neutrophils: 57 %
Platelets: 193 10*3/uL (ref 150–450)
RBC: 3.93 x10E6/uL (ref 3.77–5.28)
RDW: 13.4 % (ref 11.7–15.4)
WBC: 6.8 10*3/uL (ref 3.4–10.8)

## 2023-04-05 LAB — LIPID PANEL
Chol/HDL Ratio: 2 ratio (ref 0.0–4.4)
Cholesterol, Total: 111 mg/dL (ref 100–199)
HDL: 55 mg/dL (ref >39)
LDL Chol Calc (NIH): 41 mg/dL (ref 0–99)
Triglycerides: 74 mg/dL (ref 0–149)
VLDL Cholesterol Cal: 15 mg/dL (ref 5–40)

## 2023-07-06 ENCOUNTER — Ambulatory Visit: Payer: Medicare Other | Admitting: Family Medicine

## 2023-08-20 ENCOUNTER — Telehealth: Payer: Self-pay | Admitting: Family Medicine

## 2023-09-12 NOTE — Telephone Encounter (Signed)
Schedule pt apt for diabetic eye exam

## 2023-10-19 ENCOUNTER — Other Ambulatory Visit: Payer: Self-pay | Admitting: *Deleted

## 2023-10-19 DIAGNOSIS — E1110 Type 2 diabetes mellitus with ketoacidosis without coma: Secondary | ICD-10-CM

## 2023-10-19 MED ORDER — ONETOUCH VERIO VI STRP
ORAL_STRIP | 1 refills | Status: DC
Start: 2023-10-19 — End: 2023-12-27

## 2023-12-11 ENCOUNTER — Telehealth: Payer: Self-pay

## 2023-12-11 ENCOUNTER — Other Ambulatory Visit: Payer: Self-pay | Admitting: Family Medicine

## 2023-12-11 DIAGNOSIS — I152 Hypertension secondary to endocrine disorders: Secondary | ICD-10-CM

## 2023-12-11 NOTE — Telephone Encounter (Signed)
 Called and spoke with patient orders form BMP placed appt made.

## 2023-12-11 NOTE — Telephone Encounter (Signed)
 Copied from CRM [id number]. Topic: Appointments - Scheduling Inquiry for Clinic >> Dec 11, 2023  1:18 PM Maree Krabbe H wrote: Reason for CRM: Patients husband Trey Paula called today to set up a panel test for his wife and is requesting a callback, patients callback number is [phone].

## 2023-12-13 ENCOUNTER — Other Ambulatory Visit

## 2023-12-17 ENCOUNTER — Other Ambulatory Visit

## 2023-12-17 DIAGNOSIS — I152 Hypertension secondary to endocrine disorders: Secondary | ICD-10-CM

## 2023-12-18 LAB — BMP8+EGFR
BUN/Creatinine Ratio: 23 (ref 12–28)
BUN: 21 mg/dL (ref 8–27)
CO2: 17 mmol/L — ABNORMAL LOW (ref 20–29)
Calcium: 9.6 mg/dL (ref 8.7–10.3)
Chloride: 108 mmol/L — ABNORMAL HIGH (ref 96–106)
Creatinine, Ser: 0.93 mg/dL (ref 0.57–1.00)
Glucose: 132 mg/dL — ABNORMAL HIGH (ref 70–99)
Potassium: 3.9 mmol/L (ref 3.5–5.2)
Sodium: 146 mmol/L — ABNORMAL HIGH (ref 134–144)
eGFR: 67 mL/min/{1.73_m2} (ref >59)

## 2023-12-19 ENCOUNTER — Encounter: Payer: Self-pay | Admitting: Family Medicine

## 2023-12-25 IMAGING — PT NM PET TUM IMG INITIAL (PI) SKULL BASE T - THIGH
1 of 7 series · 1 of 25 positions shown · non-contrast
Comparison: MRI on 02/08/2022

CLINICAL DATA: Initial treatment strategy for left-sided urothelial
carcinoma.

EXAM:
NUCLEAR MEDICINE PET SKULL BASE TO THIGH
TECHNIQUE: 10.1 mCi F-18 FDG was injected intravenously. Full-ring PET imaging
was performed from the skull base to thigh after the radiotracer. CT
data was obtained and used for attenuation correction and anatomic
localization.
Fasting blood glucose: 125 mg/dl

[Series 4: ct sk_thigh 5.0 br38 · axial · 5.0mm · 0.98mm/px · 1 of 212 slices shown]
[im 212/212  brain]
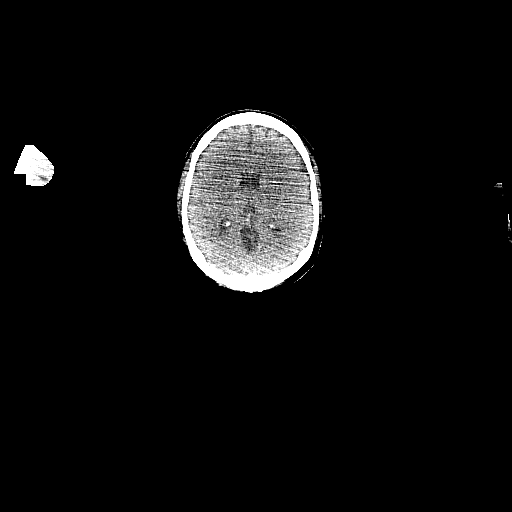

[1 of 25 positions shown; findings below may reference images not displayed]

FINDINGS: Mediastinal blood-pool activity (background): SUV max =

Liver activity (reference): SUV max = N/A

NECK:  No hypermetabolic lymph nodes or masses.

Incidental CT findings:  None.

CHEST: No hypermetabolic lymph nodes. No suspicious pulmonary
nodules seen on CT images.

Incidental CT findings:  None.

ABDOMEN/PELVIS: A large lobulated soft tissue mass is seen involving
the posterior mid and lower poles of the left kidney as well as the
left renal sinus. This measures 14.7 x 11.5 cm on image 118/4, with
SUV max of 4.2. Moderate dilatation is seen involving the left upper
pole renal collecting system due to obstruction by tumor in the
renal sinus.

Small left paraaortic and aortocaval lymph nodes are noted, which
show mild FDG uptake. Index lymph node measuring 1.4 cm on image
122/4, with SUV max of 2.8. No evidence of hypermetabolic
lymphadenopathy within the pelvis. No abnormal hypermetabolic
activity within the liver, pancreas, adrenal glands, or spleen.

Incidental CT findings: Diverticulosis is seen mainly involving the
sigmoid colon, however there is no evidence of diverticulitis. Prior
hysterectomy also noted.

SKELETON: No focal hypermetabolic bone lesions to suggest skeletal
metastasis.

Incidental CT findings:  None.
IMPRESSION: Large hypermetabolic left renal soft tissue mass, consistent with
known urothelial carcinoma. Obstructive hydronephrosis involving the
upper pole collecting system.

Mild abdominal retroperitoneal lymphadenopathy with mild FDG uptake,
consistent with metastatic disease.

No evidence of metastatic disease within the neck, chest, or pelvis.

## 2023-12-27 ENCOUNTER — Ambulatory Visit: Payer: Medicare Other | Admitting: Family Medicine

## 2023-12-27 ENCOUNTER — Encounter: Payer: Self-pay | Admitting: Family Medicine

## 2023-12-27 VITALS — BP 117/68 | HR 70 | Ht 61.0 in | Wt 209.0 lb

## 2023-12-27 DIAGNOSIS — E1110 Type 2 diabetes mellitus with ketoacidosis without coma: Secondary | ICD-10-CM | POA: Diagnosis not present

## 2023-12-27 DIAGNOSIS — R809 Proteinuria, unspecified: Secondary | ICD-10-CM

## 2023-12-27 DIAGNOSIS — I152 Hypertension secondary to endocrine disorders: Secondary | ICD-10-CM

## 2023-12-27 DIAGNOSIS — E1129 Type 2 diabetes mellitus with other diabetic kidney complication: Secondary | ICD-10-CM | POA: Diagnosis not present

## 2023-12-27 DIAGNOSIS — E1159 Type 2 diabetes mellitus with other circulatory complications: Secondary | ICD-10-CM | POA: Diagnosis not present

## 2023-12-27 DIAGNOSIS — E1169 Type 2 diabetes mellitus with other specified complication: Secondary | ICD-10-CM | POA: Diagnosis not present

## 2023-12-27 DIAGNOSIS — Z7984 Long term (current) use of oral hypoglycemic drugs: Secondary | ICD-10-CM

## 2023-12-27 DIAGNOSIS — E785 Hyperlipidemia, unspecified: Secondary | ICD-10-CM

## 2023-12-27 LAB — BAYER DCA HB A1C WAIVED: HB A1C (BAYER DCA - WAIVED): 6 % — ABNORMAL HIGH (ref 4.8–5.6)

## 2023-12-27 MED ORDER — AMLODIPINE BESYLATE 10 MG PO TABS: 10.0000 mg | ORAL_TABLET | Freq: Every day | ORAL | 3 refills | Status: DC

## 2023-12-27 MED ORDER — ONETOUCH VERIO VI STRP: ORAL_STRIP | 1 refills | Status: DC

## 2023-12-27 MED ORDER — METFORMIN HCL 1000 MG PO TABS: 1000.0000 mg | ORAL_TABLET | Freq: Two times a day (BID) | ORAL | 3 refills | Status: DC

## 2023-12-27 MED ORDER — LOSARTAN POTASSIUM 25 MG PO TABS: 25.0000 mg | ORAL_TABLET | Freq: Every day | ORAL | 3 refills | Status: DC

## 2023-12-27 MED ORDER — ROSUVASTATIN CALCIUM 10 MG PO TABS: 10.0000 mg | ORAL_TABLET | Freq: Every day | ORAL | 3 refills | Status: DC

## 2023-12-27 NOTE — Progress Notes (Signed)
 BP 117/68   Pulse 70   Ht 5\' 1"  (1.549 m)   Wt 209 lb (94.8 kg)   SpO2 95%   BMI 39.49 kg/m    Subjective:   Patient ID: Samantha Camacho, female    DOB: 11-13-1955, 68 y.o.   MRN: [id number]  HPI: Samantha Camacho is a 68 y.o. female presenting on 12/27/2023 for Medical Management of Chronic Issues, Diabetes, Hypertension, and Hyperlipidemia   HPI Type 2 diabetes mellitus Patient comes in today for recheck of his diabetes. Patient has been currently taking metformin. Patient is currently on an ACE inhibitor/ARB. Patient has not seen an ophthalmologist this year. Patient denies any new issues with their feet. The symptom started onset as an adult hypertension and hyperlipidemia ARE RELATED TO DM   Hypertension Patient is currently on losartan and amlodipine, and their blood pressure today is 117/68. Patient denies any lightheadedness or dizziness. Patient denies headaches, blurred vision, chest pains, shortness of breath, or weakness. Denies any side effects from medication and is content with current medication.   Hyperlipidemia Patient is coming in for recheck of his hyperlipidemia. The patient is currently taking the Crestor. They deny any issues with myalgias or history of liver damage from it. They deny any focal numbness or weakness or chest pain.   Relevant past medical, surgical, family and social history reviewed and updated as indicated. Interim medical history since our last visit reviewed. Allergies and medications reviewed and updated.  Review of Systems  Constitutional:  Negative for chills and fever.  HENT:  Negative for congestion, ear discharge and ear pain.   Eyes:  Negative for redness and visual disturbance.  Respiratory:  Negative for chest tightness and shortness of breath.   Cardiovascular:  Negative for chest pain and leg swelling.  Genitourinary:  Negative for difficulty urinating and dysuria.  Musculoskeletal:  Negative for back pain and gait problem.   Skin:  Negative for rash.  Neurological:  Negative for dizziness, light-headedness and headaches.  Psychiatric/Behavioral:  Negative for agitation and behavioral problems.   All other systems reviewed and are negative.   Per HPI unless specifically indicated above   Allergies as of 12/27/2023   No Known Allergies      Medication List        Accurate as of December 27, 2023  3:45 PM. If you have any questions, ask your nurse or doctor.          amLODipine 10 MG tablet Commonly known as: NORVASC Take 1 tablet (10 mg total) by mouth daily.   cholecalciferol 1000 units tablet Commonly known as: VITAMIN D Take 1,000 Units by mouth daily.   losartan 25 MG tablet Commonly known as: COZAAR Take 1 tablet (25 mg total) by mouth daily.   metFORMIN 1000 MG tablet Commonly known as: GLUCOPHAGE Take 1 tablet (1,000 mg total) by mouth 2 (two) times daily with a meal.   OneTouch Verio test strip Generic drug: glucose blood CHECK GLUCOSE TWICE DAILY Dx E11.9 What changed: additional instructions Changed by: Elige Radon Braxton Vantrease   rosuvastatin 10 MG tablet Commonly known as: CRESTOR Take 1 tablet (10 mg total) by mouth at bedtime.         Objective:   BP 117/68   Pulse 70   Ht 5\' 1"  (1.549 m)   Wt 209 lb (94.8 kg)   SpO2 95%   BMI 39.49 kg/m   Wt Readings from Last 3 Encounters:  12/27/23 209 lb (94.8 kg)  04/04/23 214  lb (97.1 kg)  12/29/22 215 lb (97.5 kg)    Physical Exam Vitals and nursing note reviewed.  Constitutional:      General: She is not in acute distress.    Appearance: She is well-developed. She is not diaphoretic.  Eyes:     Conjunctiva/sclera: Conjunctivae normal.  Cardiovascular:     Rate and Rhythm: Normal rate and regular rhythm.     Heart sounds: Normal heart sounds. No murmur heard. Pulmonary:     Effort: Pulmonary effort is normal. No respiratory distress.     Breath sounds: Normal breath sounds. No wheezing.  Musculoskeletal:         General: No swelling. Normal range of motion.  Skin:    General: Skin is warm and dry.     Findings: No rash.  Neurological:     Mental Status: She is alert and oriented to person, place, and time.     Coordination: Coordination normal.  Psychiatric:        Behavior: Behavior normal.       Assessment & Plan:   Problem List Items Addressed This Visit       Cardiovascular and Mediastinum   Hypertension associated with diabetes (HCC) - Primary   Relevant Medications   amLODipine (NORVASC) 10 MG tablet   losartan (COZAAR) 25 MG tablet   metFORMIN (GLUCOPHAGE) 1000 MG tablet   rosuvastatin (CRESTOR) 10 MG tablet   Other Relevant Orders   CBC with Differential/Platelet   CMP14+EGFR   Lipid panel   Bayer DCA Hb A1c Waived     Endocrine   Type 2 diabetes mellitus (HCC)   Relevant Medications   glucose blood (ONETOUCH VERIO) test strip   losartan (COZAAR) 25 MG tablet   metFORMIN (GLUCOPHAGE) 1000 MG tablet   rosuvastatin (CRESTOR) 10 MG tablet   Other Relevant Orders   CBC with Differential/Platelet   CMP14+EGFR   Lipid panel   Bayer DCA Hb A1c Waived   Microalbumin/Creatinine Ratio, Urine   Hyperlipidemia associated with type 2 diabetes mellitus (HCC)   Relevant Medications   amLODipine (NORVASC) 10 MG tablet   losartan (COZAAR) 25 MG tablet   metFORMIN (GLUCOPHAGE) 1000 MG tablet   rosuvastatin (CRESTOR) 10 MG tablet   Other Relevant Orders   CBC with Differential/Platelet   CMP14+EGFR   Lipid panel   Bayer DCA Hb A1c Waived   Other Visit Diagnoses       Type 2 diabetes mellitus with microalbuminuria, without long-term current use of insulin (HCC)       Relevant Medications   losartan (COZAAR) 25 MG tablet   metFORMIN (GLUCOPHAGE) 1000 MG tablet   rosuvastatin (CRESTOR) 10 MG tablet       Blood pressure looks good.  A1c is: 6.0, looks good, no changes  Seems to be doing well, no changes Follow up plan: Return in about 3 months (around 03/28/2024), or  if symptoms worsen or fail to improve, for Diabetes and hypertension and cholesterol.  Counseling provided for all of the vaccine components Orders Placed This Encounter  Procedures   CBC with Differential/Platelet   CMP14+EGFR   Lipid panel   Bayer DCA Hb A1c Waived   Microalbumin/Creatinine Ratio, Urine    Arville Care, MD [facility] 12/27/2023, 3:45 PM

## 2023-12-28 LAB — CMP14+EGFR
ALT: 34 IU/L — ABNORMAL HIGH (ref 0–32)
AST: 25 IU/L (ref 0–40)
Albumin: 4.4 g/dL (ref 3.9–4.9)
Alkaline Phosphatase: 80 IU/L (ref 44–121)
BUN/Creatinine Ratio: 17 (ref 12–28)
BUN: 19 mg/dL (ref 8–27)
Bilirubin Total: 0.3 mg/dL (ref 0.0–1.2)
CO2: 20 mmol/L (ref 20–29)
Calcium: 9.4 mg/dL (ref 8.7–10.3)
Chloride: 108 mmol/L — ABNORMAL HIGH (ref 96–106)
Creatinine, Ser: 1.09 mg/dL — ABNORMAL HIGH (ref 0.57–1.00)
Globulin, Total: 2.7 g/dL (ref 1.5–4.5)
Glucose: 86 mg/dL (ref 70–99)
Potassium: 4.3 mmol/L (ref 3.5–5.2)
Sodium: 142 mmol/L (ref 134–144)
Total Protein: 7.1 g/dL (ref 6.0–8.5)
eGFR: 55 mL/min/{1.73_m2} — ABNORMAL LOW (ref >59)

## 2023-12-28 LAB — MICROALBUMIN / CREATININE URINE RATIO
Creatinine, Urine: 141.5 mg/dL
Microalb/Creat Ratio: 66 mg/g{creat} — ABNORMAL HIGH (ref 0–29)
Microalbumin, Urine: 93.1 ug/mL

## 2023-12-28 LAB — LIPID PANEL
Chol/HDL Ratio: 2.6 ratio (ref 0.0–4.4)
Cholesterol, Total: 86 mg/dL — ABNORMAL LOW (ref 100–199)
HDL: 33 mg/dL — ABNORMAL LOW (ref >39)
LDL Chol Calc (NIH): 34 mg/dL (ref 0–99)
Triglycerides: 100 mg/dL (ref 0–149)
VLDL Cholesterol Cal: 19 mg/dL (ref 5–40)

## 2023-12-28 LAB — CBC WITH DIFFERENTIAL/PLATELET
Basophils Absolute: 0 10*3/uL (ref 0.0–0.2)
Basos: 1 %
EOS (ABSOLUTE): 0.2 10*3/uL (ref 0.0–0.4)
Eos: 2 %
Hematocrit: 36.4 % (ref 34.0–46.6)
Hemoglobin: 11.9 g/dL (ref 11.1–15.9)
Immature Grans (Abs): 0 10*3/uL (ref 0.0–0.1)
Immature Granulocytes: 0 %
Lymphocytes Absolute: 2.4 10*3/uL (ref 0.7–3.1)
Lymphs: 29 %
MCH: 30.1 pg (ref 26.6–33.0)
MCHC: 32.7 g/dL (ref 31.5–35.7)
MCV: 92 fL (ref 79–97)
Monocytes Absolute: 0.5 10*3/uL (ref 0.1–0.9)
Monocytes: 7 %
Neutrophils Absolute: 5 10*3/uL (ref 1.4–7.0)
Neutrophils: 61 %
Platelets: 213 10*3/uL (ref 150–450)
RBC: 3.96 x10E6/uL (ref 3.77–5.28)
RDW: 13.1 % (ref 11.7–15.4)
WBC: 8.2 10*3/uL (ref 3.4–10.8)

## 2023-12-31 ENCOUNTER — Encounter: Payer: Self-pay | Admitting: Family Medicine

## 2023-12-31 LAB — HM DIABETES EYE EXAM

## 2024-01-02 ENCOUNTER — Other Ambulatory Visit: Payer: Self-pay | Admitting: Family Medicine

## 2024-01-02 ENCOUNTER — Encounter: Payer: Self-pay | Admitting: Family Medicine

## 2024-01-02 DIAGNOSIS — E1110 Type 2 diabetes mellitus with ketoacidosis without coma: Secondary | ICD-10-CM

## 2024-05-15 ENCOUNTER — Telehealth: Payer: Self-pay

## 2024-05-15 NOTE — Telephone Encounter (Signed)
 Copied from CRM [id number]. Topic: Clinical - Medication Question >> May 15, 2024  1:07 PM Delon DASEN wrote: Reason for CRM: husband Chyrl calling with question about wife being on 1000 MG of Metformin  with only one kidney, he saw a program saying it could be harmful- he would love a phone call to discuss  [phone]

## 2024-05-16 NOTE — Telephone Encounter (Signed)
 I called and spoke with patients husband regarding this call. Made husband aware that her kidney levels are being monitored and there were no issues with kidneys at her last visit but did make him aware that patient is past due to have her labs rechecked. Looks like she was supposed to be seen in June by PCP for a 3 mth follow up and recheck labs. Husband voiced understanding and said that he would speak to patient about this and get something scheduled soon.

## 2024-10-13 ENCOUNTER — Ambulatory Visit: Admitting: Family Medicine

## 2024-10-13 VITALS — BP 121/80 | HR 65 | Ht 61.0 in | Wt 214.0 lb

## 2024-10-13 DIAGNOSIS — R809 Proteinuria, unspecified: Secondary | ICD-10-CM

## 2024-10-13 DIAGNOSIS — Z7984 Long term (current) use of oral hypoglycemic drugs: Secondary | ICD-10-CM | POA: Diagnosis not present

## 2024-10-13 DIAGNOSIS — E785 Hyperlipidemia, unspecified: Secondary | ICD-10-CM | POA: Diagnosis not present

## 2024-10-13 DIAGNOSIS — I152 Hypertension secondary to endocrine disorders: Secondary | ICD-10-CM | POA: Diagnosis not present

## 2024-10-13 DIAGNOSIS — E1110 Type 2 diabetes mellitus with ketoacidosis without coma: Secondary | ICD-10-CM

## 2024-10-13 DIAGNOSIS — Z1231 Encounter for screening mammogram for malignant neoplasm of breast: Secondary | ICD-10-CM

## 2024-10-13 DIAGNOSIS — E1129 Type 2 diabetes mellitus with other diabetic kidney complication: Secondary | ICD-10-CM

## 2024-10-13 DIAGNOSIS — E1169 Type 2 diabetes mellitus with other specified complication: Secondary | ICD-10-CM | POA: Diagnosis not present

## 2024-10-13 DIAGNOSIS — K432 Incisional hernia without obstruction or gangrene: Secondary | ICD-10-CM | POA: Diagnosis not present

## 2024-10-13 DIAGNOSIS — E1159 Type 2 diabetes mellitus with other circulatory complications: Secondary | ICD-10-CM

## 2024-10-13 LAB — BAYER DCA HB A1C WAIVED: HB A1C (BAYER DCA - WAIVED): 7.4 % — ABNORMAL HIGH (ref 4.8–5.6)

## 2024-10-13 MED ORDER — LOSARTAN POTASSIUM 25 MG PO TABS: 25.0000 mg | ORAL_TABLET | Freq: Every day | ORAL | 3 refills | Status: AC

## 2024-10-13 MED ORDER — ROSUVASTATIN CALCIUM 10 MG PO TABS: 10.0000 mg | ORAL_TABLET | Freq: Every day | ORAL | 3 refills | Status: AC

## 2024-10-13 MED ORDER — METFORMIN HCL 1000 MG PO TABS: 1000.0000 mg | ORAL_TABLET | Freq: Two times a day (BID) | ORAL | 3 refills | Status: AC

## 2024-10-13 MED ORDER — AMLODIPINE BESYLATE 10 MG PO TABS: 10.0000 mg | ORAL_TABLET | Freq: Every day | ORAL | 3 refills | Status: AC

## 2024-10-13 NOTE — Progress Notes (Signed)
 "  BP 121/80   Pulse 65   Ht 5' 1 (1.549 m)   Wt 214 lb (97.1 kg)   SpO2 97%   BMI 40.43 kg/m    Subjective:   Patient ID: Samantha Camacho, female    DOB: 07-14-1956, 69 y.o.   MRN: [id number]  HPI: Samantha Camacho is a 69 y.o. female presenting on 10/13/2024 for Medical Management of Chronic Issues, Diabetes, Hypertension, and Hyperlipidemia   Discussed the use of AI scribe software for clinical note transcription with the patient, who gave verbal consent to proceed.  History of Present Illness   Samantha Camacho is a 69 year old female who presents for re-evaluation of an incisional hernia and travel clearance.  Incisional hernia - MRI revealed an incisional hernia at the site of previous abdominal surgeries, including cesarean section, total hysterectomy, cholecystectomy, and kidney surgery (August 2023) - No pain or symptoms related to the hernia - Concerned about potential for hernia complications during upcoming cruise to [place] , particularly due to insurance implications for pre-existing conditions - No current need for medication changes or new prescriptions related to the hernia  Glycemic control - Currently taking metformin  twice daily for diabetes - Recent blood glucose levels higher than usual, attributed to dietary indulgences over the holidays - Last hemoglobin A1c was 7.4% - No issues with current medication regimen          Relevant past medical, surgical, family and social history reviewed and updated as indicated. Interim medical history since our last visit reviewed. Allergies and medications reviewed and updated.  Review of Systems  Constitutional:  Negative for chills and fever.  Eyes:  Negative for visual disturbance.  Respiratory:  Negative for chest tightness and shortness of breath.   Cardiovascular:  Negative for chest pain and leg swelling.  Gastrointestinal:  Negative for abdominal distention, abdominal pain, blood in stool, constipation,  diarrhea, nausea and vomiting.  Genitourinary:  Negative for difficulty urinating and dysuria.  Musculoskeletal:  Negative for back pain and gait problem.  Skin:  Negative for rash.  Neurological:  Negative for dizziness, light-headedness and headaches.  Psychiatric/Behavioral:  Negative for agitation and behavioral problems.   All other systems reviewed and are negative.   Per HPI unless specifically indicated above   Allergies as of 10/13/2024   No Known Allergies      Medication List        Accurate as of October 13, 2024  3:13 PM. If you have any questions, ask your nurse or doctor.          amLODipine  10 MG tablet Commonly known as: NORVASC  Take 1 tablet (10 mg total) by mouth daily.   cholecalciferol  1000 units tablet Commonly known as: VITAMIN D  Take 1,000 Units by mouth daily.   losartan  25 MG tablet Commonly known as: COZAAR  Take 1 tablet (25 mg total) by mouth daily.   metFORMIN  1000 MG tablet Commonly known as: GLUCOPHAGE  Take 1 tablet (1,000 mg total) by mouth 2 (two) times daily with a meal.   OneTouch Verio test strip Generic drug: glucose blood CHECK GLUCOSE DAILY Dx E11.9   rosuvastatin  10 MG tablet Commonly known as: CRESTOR  Take 1 tablet (10 mg total) by mouth at bedtime.         Objective:   BP 121/80   Pulse 65   Ht 5' 1 (1.549 m)   Wt 214 lb (97.1 kg)   SpO2 97%   BMI 40.43 kg/m   Wt Readings from Last 3  Encounters:  10/13/24 214 lb (97.1 kg)  12/27/23 209 lb (94.8 kg)  04/04/23 214 lb (97.1 kg)    Physical Exam Vitals and nursing note reviewed.  Constitutional:      General: She is not in acute distress.    Appearance: She is well-developed. She is not diaphoretic.  Eyes:     Conjunctiva/sclera: Conjunctivae normal.  Cardiovascular:     Rate and Rhythm: Normal rate and regular rhythm.     Heart sounds: Normal heart sounds. No murmur heard. Pulmonary:     Effort: Pulmonary effort is normal. No respiratory distress.      Breath sounds: Normal breath sounds. No wheezing.  Abdominal:     General: Abdomen is flat. Bowel sounds are normal. There is no distension.     Palpations: Abdomen is soft.     Tenderness: There is no abdominal tenderness. There is no guarding or rebound.     Hernia: No hernia (No palpable hernia) is present.  Musculoskeletal:        General: No tenderness. Normal range of motion.  Skin:    General: Skin is warm and dry.     Findings: No rash.  Neurological:     Mental Status: She is alert and oriented to person, place, and time.     Coordination: Coordination normal.  Psychiatric:        Behavior: Behavior normal.       VITALS: BP- 121/80 NECK: Thyroid without masses. CHEST: Lungs clear to auscultation. CARDIOVASCULAR: Regular heart sounds, no murmurs.       Results for orders placed or performed in visit on 12/31/23  HM DIABETES EYE EXAM   Collection Time: 12/31/23 12:00 AM  Result Value Ref Range   HM Diabetic Eye Exam No Retinopathy No Retinopathy    Assessment & Plan:   Problem List Items Addressed This Visit       Cardiovascular and Mediastinum   Hypertension associated with diabetes (HCC)   Relevant Medications   amLODipine  (NORVASC ) 10 MG tablet   losartan  (COZAAR ) 25 MG tablet   metFORMIN  (GLUCOPHAGE ) 1000 MG tablet   rosuvastatin  (CRESTOR ) 10 MG tablet   Other Relevant Orders   CBC with Differential/Platelet   CMP14+EGFR   Lipid panel   TSH   Bayer DCA Hb A1c Waived   Vitamin B12     Endocrine   Type 2 diabetes mellitus (HCC) - Primary   Relevant Medications   losartan  (COZAAR ) 25 MG tablet   metFORMIN  (GLUCOPHAGE ) 1000 MG tablet   rosuvastatin  (CRESTOR ) 10 MG tablet   Other Relevant Orders   CBC with Differential/Platelet   CMP14+EGFR   Lipid panel   TSH   Bayer DCA Hb A1c Waived   Vitamin B12   Hyperlipidemia associated with type 2 diabetes mellitus (HCC)   Relevant Medications   amLODipine  (NORVASC ) 10 MG tablet   losartan  (COZAAR )  25 MG tablet   metFORMIN  (GLUCOPHAGE ) 1000 MG tablet   rosuvastatin  (CRESTOR ) 10 MG tablet   Other Relevant Orders   CBC with Differential/Platelet   CMP14+EGFR   Lipid panel   TSH   Bayer DCA Hb A1c Waived   Vitamin B12   Other Visit Diagnoses       Encounter for screening mammogram for malignant neoplasm of breast       Relevant Orders   MM 3D SCREENING MAMMOGRAM BILATERAL BREAST     Type 2 diabetes mellitus with microalbuminuria, without long-term current use of insulin  (HCC)  Relevant Medications   losartan  (COZAAR ) 25 MG tablet   metFORMIN  (GLUCOPHAGE ) 1000 MG tablet   rosuvastatin  (CRESTOR ) 10 MG tablet     Incisional hernia, without obstruction or gangrene               Incisional hernia Hernia at previous surgical sites contains fat and colon. No symptoms. Discussed strangulation risk and insurance issues during cruise. Recommended repair due to colon involvement. - Avoid heavy lifting and constipation during cruise. - Monitor for abdominal pain at hernia site. - Consider surgical repair after cruise if desired.  Type 2 diabetes mellitus with hypertension and hyperlipidemia Blood pressure controlled. Blood sugar elevated post-holiday. A1c at 7.4%. Continues on current medications without issues. - Continue metformin  twice daily. - Continue amlodipine  and losartan . - Continue Crestor . - Focus on diet improvement after cruise.  General health maintenance Mammogram scheduled. Flu shot received. - Proceed with scheduled mammogram.          Follow up plan: Return in about 3 months (around 01/11/2025), or if symptoms worsen or fail to improve, for Diabetes recheck.  Counseling provided for all of the vaccine components Orders Placed This Encounter  Procedures   MM 3D SCREENING MAMMOGRAM BILATERAL BREAST   CBC with Differential/Platelet   CMP14+EGFR   Lipid panel   TSH   Bayer DCA Hb A1c Waived   Vitamin B12    Fonda Levins, MD [place]  [facility] 10/13/2024, 3:13 PM     "

## 2024-10-14 LAB — CBC WITH DIFFERENTIAL/PLATELET
Basophils Absolute: 0.1 x10E3/uL (ref 0.0–0.2)
Basos: 1 %
EOS (ABSOLUTE): 0.3 x10E3/uL (ref 0.0–0.4)
Eos: 4 %
Hematocrit: 37.7 % (ref 34.0–46.6)
Hemoglobin: 12.2 g/dL (ref 11.1–15.9)
Immature Grans (Abs): 0 x10E3/uL (ref 0.0–0.1)
Immature Granulocytes: 0 %
Lymphocytes Absolute: 2.4 x10E3/uL (ref 0.7–3.1)
Lymphs: 28 %
MCH: 30.4 pg (ref 26.6–33.0)
MCHC: 32.4 g/dL (ref 31.5–35.7)
MCV: 94 fL (ref 79–97)
Monocytes Absolute: 0.5 x10E3/uL (ref 0.1–0.9)
Monocytes: 6 %
Neutrophils Absolute: 5.4 x10E3/uL (ref 1.4–7.0)
Neutrophils: 61 %
Platelets: 204 x10E3/uL (ref 150–450)
RBC: 4.01 x10E6/uL (ref 3.77–5.28)
RDW: 13.3 % (ref 11.7–15.4)
WBC: 8.7 x10E3/uL (ref 3.4–10.8)

## 2024-10-14 LAB — LIPID PANEL
Chol/HDL Ratio: 2.1 ratio (ref 0.0–4.4)
Cholesterol, Total: 110 mg/dL (ref 100–199)
HDL: 52 mg/dL
LDL Chol Calc (NIH): 42 mg/dL (ref 0–99)
Triglycerides: 83 mg/dL (ref 0–149)
VLDL Cholesterol Cal: 16 mg/dL (ref 5–40)

## 2024-10-14 LAB — CMP14+EGFR
ALT: 62 IU/L — ABNORMAL HIGH (ref 0–32)
AST: 57 IU/L — ABNORMAL HIGH (ref 0–40)
Albumin: 4.6 g/dL (ref 3.9–4.9)
Alkaline Phosphatase: 85 IU/L (ref 49–135)
BUN/Creatinine Ratio: 18 (ref 12–28)
BUN: 17 mg/dL (ref 8–27)
Bilirubin Total: 0.4 mg/dL (ref 0.0–1.2)
CO2: 19 mmol/L — ABNORMAL LOW (ref 20–29)
Calcium: 9.6 mg/dL (ref 8.7–10.3)
Chloride: 103 mmol/L (ref 96–106)
Creatinine, Ser: 0.93 mg/dL (ref 0.57–1.00)
Globulin, Total: 2.9 g/dL (ref 1.5–4.5)
Glucose: 100 mg/dL — ABNORMAL HIGH (ref 70–99)
Potassium: 4.2 mmol/L (ref 3.5–5.2)
Sodium: 141 mmol/L (ref 134–144)
Total Protein: 7.5 g/dL (ref 6.0–8.5)
eGFR: 67 mL/min/1.73

## 2024-10-14 LAB — TSH: TSH: 2.51 u[IU]/mL (ref 0.450–4.500)

## 2024-10-14 LAB — VITAMIN B12: Vitamin B-12: 498 pg/mL (ref 232–1245)

## 2024-10-20 ENCOUNTER — Ambulatory Visit: Payer: Self-pay | Admitting: Family Medicine

## 2024-10-20 DIAGNOSIS — R7989 Other specified abnormal findings of blood chemistry: Secondary | ICD-10-CM

## 2024-11-11 ENCOUNTER — Encounter (INDEPENDENT_AMBULATORY_CARE_PROVIDER_SITE_OTHER): Payer: Self-pay | Admitting: *Deleted

## 2024-12-29 ENCOUNTER — Ambulatory Visit (INDEPENDENT_AMBULATORY_CARE_PROVIDER_SITE_OTHER): Admitting: Gastroenterology
